# Patient Record
Sex: Female | Born: 1950 | Race: White | Hispanic: No | Smoking: Never smoker
Health system: Southern US, Community
[De-identification: ages and names within clinical notes are randomized; demographics above are authoritative.]

## PROBLEM LIST (undated history)

## (undated) DIAGNOSIS — J45909 Unspecified asthma, uncomplicated: Secondary | ICD-10-CM

## (undated) DIAGNOSIS — N200 Calculus of kidney: Secondary | ICD-10-CM

## (undated) DIAGNOSIS — K219 Gastro-esophageal reflux disease without esophagitis: Secondary | ICD-10-CM

## (undated) DIAGNOSIS — E785 Hyperlipidemia, unspecified: Secondary | ICD-10-CM

## (undated) DIAGNOSIS — I10 Essential (primary) hypertension: Secondary | ICD-10-CM

## (undated) DIAGNOSIS — T7840XA Allergy, unspecified, initial encounter: Secondary | ICD-10-CM

## (undated) HISTORY — DX: Hyperlipidemia, unspecified: E78.5

## (undated) HISTORY — DX: Calculus of kidney: N20.0

## (undated) HISTORY — DX: Unspecified asthma, uncomplicated: J45.909

## (undated) HISTORY — DX: Essential (primary) hypertension: I10

## (undated) HISTORY — PX: DILATION AND CURETTAGE OF UTERUS: SHX78

## (undated) HISTORY — DX: Gastro-esophageal reflux disease without esophagitis: K21.9

## (undated) HISTORY — PX: OTHER SURGICAL HISTORY: SHX169

## (undated) HISTORY — DX: Allergy, unspecified, initial encounter: T78.40XA

---

## 1997-10-24 ENCOUNTER — Other Ambulatory Visit: Admission: RE | Admit: 1997-10-24 | Discharge: 1997-10-24 | Payer: Self-pay | Admitting: Obstetrics and Gynecology

## 1998-10-25 ENCOUNTER — Other Ambulatory Visit: Admission: RE | Admit: 1998-10-25 | Discharge: 1998-10-25 | Payer: Self-pay | Admitting: Obstetrics and Gynecology

## 1999-11-17 ENCOUNTER — Encounter: Admission: RE | Admit: 1999-11-17 | Discharge: 1999-11-17 | Payer: Self-pay | Admitting: Internal Medicine

## 1999-11-17 ENCOUNTER — Encounter: Payer: Self-pay | Admitting: Internal Medicine

## 1999-11-26 ENCOUNTER — Encounter: Payer: Self-pay | Admitting: Internal Medicine

## 1999-11-26 ENCOUNTER — Encounter: Admission: RE | Admit: 1999-11-26 | Discharge: 1999-11-26 | Payer: Self-pay | Admitting: Internal Medicine

## 1999-12-15 ENCOUNTER — Other Ambulatory Visit: Admission: RE | Admit: 1999-12-15 | Discharge: 1999-12-15 | Payer: Self-pay | Admitting: Orthopedic Surgery

## 2000-11-29 ENCOUNTER — Encounter: Admission: RE | Admit: 2000-11-29 | Discharge: 2000-11-29 | Payer: Self-pay | Admitting: Internal Medicine

## 2000-11-29 ENCOUNTER — Encounter: Payer: Self-pay | Admitting: Internal Medicine

## 2000-12-01 ENCOUNTER — Encounter: Admission: RE | Admit: 2000-12-01 | Discharge: 2000-12-01 | Payer: Self-pay | Admitting: Internal Medicine

## 2000-12-01 ENCOUNTER — Encounter: Payer: Self-pay | Admitting: Internal Medicine

## 2000-12-15 ENCOUNTER — Other Ambulatory Visit: Admission: RE | Admit: 2000-12-15 | Discharge: 2000-12-15 | Payer: Self-pay | Admitting: Obstetrics and Gynecology

## 2001-08-26 ENCOUNTER — Encounter: Admission: RE | Admit: 2001-08-26 | Discharge: 2001-08-26 | Payer: Self-pay

## 2001-12-05 ENCOUNTER — Encounter: Admission: RE | Admit: 2001-12-05 | Discharge: 2001-12-05 | Payer: Self-pay | Admitting: Obstetrics and Gynecology

## 2001-12-05 ENCOUNTER — Encounter: Payer: Self-pay | Admitting: Obstetrics and Gynecology

## 2001-12-15 ENCOUNTER — Other Ambulatory Visit: Admission: RE | Admit: 2001-12-15 | Discharge: 2001-12-15 | Payer: Self-pay | Admitting: Obstetrics and Gynecology

## 2002-12-19 ENCOUNTER — Other Ambulatory Visit: Admission: RE | Admit: 2002-12-19 | Discharge: 2002-12-19 | Payer: Self-pay | Admitting: Obstetrics and Gynecology

## 2003-01-23 ENCOUNTER — Encounter: Admission: RE | Admit: 2003-01-23 | Discharge: 2003-01-23 | Payer: Self-pay | Admitting: Obstetrics and Gynecology

## 2003-01-23 ENCOUNTER — Encounter: Payer: Self-pay | Admitting: Obstetrics and Gynecology

## 2003-03-09 ENCOUNTER — Encounter: Admission: RE | Admit: 2003-03-09 | Discharge: 2003-03-09 | Payer: Self-pay | Admitting: Internal Medicine

## 2003-03-09 ENCOUNTER — Encounter: Payer: Self-pay | Admitting: Internal Medicine

## 2003-12-20 ENCOUNTER — Other Ambulatory Visit: Admission: RE | Admit: 2003-12-20 | Discharge: 2003-12-20 | Payer: Self-pay | Admitting: Obstetrics and Gynecology

## 2004-08-05 ENCOUNTER — Encounter: Admission: RE | Admit: 2004-08-05 | Discharge: 2004-08-05 | Payer: Self-pay | Admitting: Obstetrics and Gynecology

## 2005-07-14 ENCOUNTER — Encounter: Admission: RE | Admit: 2005-07-14 | Discharge: 2005-07-14 | Payer: Self-pay | Admitting: Family Medicine

## 2005-09-28 ENCOUNTER — Encounter: Admission: RE | Admit: 2005-09-28 | Discharge: 2005-09-28 | Payer: Self-pay | Admitting: Family Medicine

## 2005-10-16 ENCOUNTER — Ambulatory Visit (HOSPITAL_COMMUNITY): Admission: RE | Admit: 2005-10-16 | Discharge: 2005-10-16 | Payer: Self-pay | Admitting: *Deleted

## 2005-11-03 ENCOUNTER — Encounter (INDEPENDENT_AMBULATORY_CARE_PROVIDER_SITE_OTHER): Payer: Self-pay | Admitting: *Deleted

## 2005-11-03 ENCOUNTER — Ambulatory Visit (HOSPITAL_COMMUNITY): Admission: RE | Admit: 2005-11-03 | Discharge: 2005-11-03 | Payer: Self-pay | Admitting: *Deleted

## 2005-12-02 ENCOUNTER — Encounter: Admission: RE | Admit: 2005-12-02 | Discharge: 2005-12-02 | Payer: Self-pay | Admitting: Family Medicine

## 2006-01-28 ENCOUNTER — Encounter: Admission: RE | Admit: 2006-01-28 | Discharge: 2006-01-28 | Payer: Self-pay | Admitting: Internal Medicine

## 2006-12-06 ENCOUNTER — Encounter: Admission: RE | Admit: 2006-12-06 | Discharge: 2006-12-06 | Payer: Self-pay | Admitting: Internal Medicine

## 2006-12-14 ENCOUNTER — Encounter: Admission: RE | Admit: 2006-12-14 | Discharge: 2006-12-14 | Payer: Self-pay | Admitting: Dermatology

## 2007-12-07 ENCOUNTER — Encounter: Admission: RE | Admit: 2007-12-07 | Discharge: 2007-12-07 | Payer: Self-pay | Admitting: Family Medicine

## 2009-01-14 ENCOUNTER — Encounter: Admission: RE | Admit: 2009-01-14 | Discharge: 2009-01-14 | Payer: Self-pay | Admitting: Family Medicine

## 2009-01-31 ENCOUNTER — Encounter: Admission: RE | Admit: 2009-01-31 | Discharge: 2009-01-31 | Payer: Self-pay | Admitting: Family Medicine

## 2010-01-20 ENCOUNTER — Encounter: Admission: RE | Admit: 2010-01-20 | Discharge: 2010-01-20 | Payer: Self-pay | Admitting: Family Medicine

## 2010-06-28 ENCOUNTER — Encounter: Payer: Self-pay | Admitting: Obstetrics and Gynecology

## 2010-06-29 ENCOUNTER — Encounter: Payer: Self-pay | Admitting: Family Medicine

## 2010-10-21 HISTORY — PX: CHOLECYSTECTOMY: SHX55

## 2010-10-24 NOTE — Op Note (Signed)
NAMELALITHA, ILYAS            ACCOUNT NO.:  [id number]   MEDICAL RECORD NO.:  [id number]          PATIENT TYPE:  AMB   LOCATION:  DAY                          FACILITY:  [facility]   PHYSICIAN:  Alfonse Ras, MD   DATE OF BIRTH:  01-12-1951   DATE OF PROCEDURE:  11/03/2005  DATE OF DISCHARGE:                                 OPERATIVE REPORT   PREOPERATIVE DIAGNOSIS:  Symptomatic cholelithiasis   POSTOPERATIVE DIAGNOSIS:  Symptomatic cholelithiasis.   PROCEDURES:  Laparoscopic cholecystectomy.   SURGEON:  Alfonse Ras, MD.   ASSISTANT:  No assistant.   ANESTHESIA:  General.   DESCRIPTION:  The patient was taken to the operating room, placed in supine  position and after adequate general anesthesia was induced using  endotracheal tube, the abdomen was prepped and draped in the normal sterile  fashion.  Using a transverse infraumbilical incision, I dissected down to  the fascia.  The fascia was opened vertically.  An #0 Vicryl pursestring  suture was placed around the fascial defect.  The Hasson trocar was placed  and pneumoperitoneum was obtained.  An 11 mm trocar was placed in the  subxiphoid region and two 5 mm ports were placed in the right abdomen.  The  gallbladder was identified and retracted cephalad.  Dissecting at the neck,  a critical view was obtained of the cystic duct and cystic artery which were  actually quite long and thin.  They were triply clipped and divided easily.  Adequate hemostasis was ensured.  The gallbladder was then taken off the  gallbladder bed using Bovie electrocautery, placed in an EndoCatch bag and  removed through the umbilical port.  A piece of Surgicel was placed in the  gallbladder bed even though there was minimal bleeding.  The  pneumoperitoneum was released, trocars were removed.  The infraumbilical  fascial defect was closed with the #0 Vicryl pursestring suture.  Skin  incisions were closed with subcuticular 4-0 Monocryl.   Steri-Strips and  sterile dressings were applied.  The patient tolerated the procedure well  and went to PACU in good condition.      Alfonse Ras, MD  Electronically Signed     KRE/MEDQ  D:  11/03/2005  T:  11/03/2005  Job:  [id number]

## 2010-12-23 ENCOUNTER — Other Ambulatory Visit: Payer: Self-pay | Admitting: Family Medicine

## 2010-12-23 DIAGNOSIS — Z1231 Encounter for screening mammogram for malignant neoplasm of breast: Secondary | ICD-10-CM

## 2011-01-08 ENCOUNTER — Ambulatory Visit: Payer: Self-pay | Admitting: *Deleted

## 2011-01-20 ENCOUNTER — Ambulatory Visit: Payer: Self-pay | Admitting: *Deleted

## 2011-01-26 ENCOUNTER — Ambulatory Visit
Admission: RE | Admit: 2011-01-26 | Discharge: 2011-01-26 | Disposition: A | Discharge status: Home / Self Care | Payer: 59 | Source: Ambulatory Visit | Attending: Family Medicine | Admitting: Family Medicine

## 2011-01-26 DIAGNOSIS — Z1231 Encounter for screening mammogram for malignant neoplasm of breast: Secondary | ICD-10-CM

## 2011-01-29 ENCOUNTER — Other Ambulatory Visit: Payer: Self-pay | Admitting: Family Medicine

## 2011-01-29 DIAGNOSIS — R928 Other abnormal and inconclusive findings on diagnostic imaging of breast: Secondary | ICD-10-CM

## 2011-02-03 ENCOUNTER — Ambulatory Visit
Admission: RE | Admit: 2011-02-03 | Discharge: 2011-02-03 | Disposition: A | Discharge status: Home / Self Care | Payer: 59 | Source: Ambulatory Visit | Attending: Family Medicine | Admitting: Family Medicine

## 2011-02-03 DIAGNOSIS — R928 Other abnormal and inconclusive findings on diagnostic imaging of breast: Secondary | ICD-10-CM

## 2011-02-05 ENCOUNTER — Encounter: Payer: 59 | Attending: Family Medicine | Admitting: *Deleted

## 2011-02-05 DIAGNOSIS — Z713 Dietary counseling and surveillance: Secondary | ICD-10-CM | POA: Insufficient documentation

## 2011-02-05 DIAGNOSIS — R7309 Other abnormal glucose: Secondary | ICD-10-CM | POA: Insufficient documentation

## 2011-02-06 ENCOUNTER — Encounter: Payer: Self-pay | Admitting: *Deleted

## 2011-02-06 NOTE — Progress Notes (Signed)
Medical Nutrition Therapy:  Appt start time: 1500  End time:  1600.  Assessment:  Primary concerns today: T2DM Assessment.  Pt states she is very overwhelmed by diagnosis. Reports dx in June 2012 (A1c not available at time of visit) and continues to ask "how did I get this?".  Reports high levels of stress starting in 2006 with the loss of her husband to throat CA. Job highly stressful, but walks 2x/day during breaks with co-worker ~3 d/wk.  Unable to obtain food recall, but pt states she eats out "some" and snacks on baked cheetos and yoplait flavored yogurt. Currently not checking BG d/t faulty meter. Has been replaced and states she will start checking.  Pt wishes to verify insurance coverage of DM before making f/u appt.      MEDICATIONS: Amlodipine, Pravastatin   DIETARY INTAKE:  Unable to obtain. Will attempt again at f/u visit.  Usual physical activity:  Walks 2x/day at work for 15-20 min ea (~3d/wk)  Estimated energy needs: 1500 calories (Maintenance calories per pt request) 170 g carbohydrates 95 g protein 50 g fat  Progress Towards Goal(s):  NEW.   Nutritional Diagnosis:  [place]-2.1 Impaired nutrient utilization related to excessive CHO intake as evidenced by new diagnosis of T2DM and referral by MD for assessment.   Intervention/Goals:  Follow Diabetes Meal Plan as instructed (see yellow card).  Limit carbohydrate intake to 45 grams/meal and 15 grams/snack.  Add lean protein foods to all meals/snacks.  Monitor glucose levels as instructed by your doctor and bring log to next visit.  Aim for 60 mins of physical activity daily.  Monitoring/Evaluation:  Dietary intake, exercise, A1c, blood glucose, and body weight 4 weeks.

## 2011-02-07 ENCOUNTER — Encounter: Payer: Self-pay | Admitting: *Deleted

## 2011-02-07 NOTE — Patient Instructions (Addendum)
Goals:  Follow Diabetes Meal Plan as instructed (see yellow card).  Limit carbohydrate intake to 45 grams/meal and 15 grams/snack.  Add lean protein foods to all meals/snacks.  Monitor glucose levels as instructed by your doctor and bring log to next visit.  Aim for 60 mins of physical activity daily.

## 2011-04-06 ENCOUNTER — Encounter: Payer: 59 | Attending: Family Medicine | Admitting: *Deleted

## 2011-04-06 DIAGNOSIS — R7309 Other abnormal glucose: Secondary | ICD-10-CM | POA: Insufficient documentation

## 2011-04-06 DIAGNOSIS — Z713 Dietary counseling and surveillance: Secondary | ICD-10-CM | POA: Insufficient documentation

## 2011-04-07 ENCOUNTER — Encounter: Payer: Self-pay | Admitting: *Deleted

## 2011-04-07 NOTE — Patient Instructions (Addendum)
Patient did not register to attend Core Diabetes Education Classes II and III;  will follow up prn.

## 2011-04-07 NOTE — Progress Notes (Signed)
Patient was seen on 04/06/11 for the first of a series of three diabetes self-management courses at [facility]. The following learning objectives were met by the patient during this course:   Defines diabetes and the role of insulin  Identifies type of diabetes and pathophysiology  States normal BG range and personal goals  Identifies three risk factors for the development of diabetes  States the need for and frequency of healthcare follow up (ADA Standards of Care)  Last A1c:  6.8% (12/03/10)  Handouts given during class include:  [facility] Core Class 1 Handout  ADA: What's My Number (A1c handout)  [facility] ADA Standards of Care Handout  [facility] Medication Log handout  Patient has established the following initial goals:  Pt did not set goals at visit.  Follow-Up Plan:  PRN

## 2011-07-13 ENCOUNTER — Other Ambulatory Visit: Payer: Self-pay | Admitting: Oncology

## 2012-02-05 ENCOUNTER — Other Ambulatory Visit: Payer: Self-pay | Admitting: Family Medicine

## 2012-02-05 DIAGNOSIS — Z1231 Encounter for screening mammogram for malignant neoplasm of breast: Secondary | ICD-10-CM

## 2012-03-08 ENCOUNTER — Ambulatory Visit
Admission: RE | Admit: 2012-03-08 | Discharge: 2012-03-08 | Disposition: A | Discharge status: Home / Self Care | Payer: 59 | Source: Ambulatory Visit | Attending: Family Medicine | Admitting: Family Medicine

## 2012-03-08 DIAGNOSIS — Z1231 Encounter for screening mammogram for malignant neoplasm of breast: Secondary | ICD-10-CM

## 2012-03-09 ENCOUNTER — Other Ambulatory Visit: Payer: Self-pay | Admitting: Family Medicine

## 2012-03-10 ENCOUNTER — Other Ambulatory Visit: Payer: Self-pay | Admitting: Family Medicine

## 2012-03-10 DIAGNOSIS — R928 Other abnormal and inconclusive findings on diagnostic imaging of breast: Secondary | ICD-10-CM

## 2012-03-15 ENCOUNTER — Ambulatory Visit
Admission: RE | Admit: 2012-03-15 | Discharge: 2012-03-15 | Disposition: A | Discharge status: Home / Self Care | Payer: 59 | Source: Ambulatory Visit | Attending: Family Medicine | Admitting: Family Medicine

## 2012-03-15 DIAGNOSIS — R928 Other abnormal and inconclusive findings on diagnostic imaging of breast: Secondary | ICD-10-CM

## 2012-09-28 ENCOUNTER — Other Ambulatory Visit: Payer: Self-pay | Admitting: Family Medicine

## 2012-09-28 DIAGNOSIS — N951 Menopausal and female climacteric states: Secondary | ICD-10-CM

## 2012-11-11 ENCOUNTER — Other Ambulatory Visit: Payer: 59

## 2012-12-19 ENCOUNTER — Other Ambulatory Visit: Payer: 59

## 2012-12-26 ENCOUNTER — Other Ambulatory Visit: Payer: 59

## 2013-10-03 ENCOUNTER — Other Ambulatory Visit: Payer: Self-pay | Admitting: Family Medicine

## 2013-10-03 DIAGNOSIS — Z1231 Encounter for screening mammogram for malignant neoplasm of breast: Secondary | ICD-10-CM

## 2013-10-09 ENCOUNTER — Ambulatory Visit
Admission: RE | Admit: 2013-10-09 | Discharge: 2013-10-09 | Disposition: A | Discharge status: Home / Self Care | Payer: No Typology Code available for payment source | Source: Ambulatory Visit | Attending: Family Medicine | Admitting: Family Medicine

## 2013-10-09 ENCOUNTER — Encounter (INDEPENDENT_AMBULATORY_CARE_PROVIDER_SITE_OTHER): Payer: Self-pay

## 2013-10-09 DIAGNOSIS — Z1231 Encounter for screening mammogram for malignant neoplasm of breast: Secondary | ICD-10-CM

## 2014-10-02 ENCOUNTER — Other Ambulatory Visit: Payer: Self-pay

## 2014-10-02 DIAGNOSIS — Z1231 Encounter for screening mammogram for malignant neoplasm of breast: Secondary | ICD-10-CM

## 2014-10-30 ENCOUNTER — Other Ambulatory Visit: Payer: Self-pay | Admitting: Family Medicine

## 2014-10-30 DIAGNOSIS — E2839 Other primary ovarian failure: Secondary | ICD-10-CM

## 2014-11-01 ENCOUNTER — Ambulatory Visit
Admission: RE | Admit: 2014-11-01 | Discharge: 2014-11-01 | Disposition: A | Discharge status: Home / Self Care | Payer: 59 | Source: Ambulatory Visit

## 2014-11-01 ENCOUNTER — Ambulatory Visit
Admission: RE | Admit: 2014-11-01 | Discharge: 2014-11-01 | Disposition: A | Discharge status: Home / Self Care | Payer: 59 | Source: Ambulatory Visit | Attending: Family Medicine | Admitting: Family Medicine

## 2014-11-01 DIAGNOSIS — E2839 Other primary ovarian failure: Secondary | ICD-10-CM

## 2014-11-01 DIAGNOSIS — Z1231 Encounter for screening mammogram for malignant neoplasm of breast: Secondary | ICD-10-CM

## 2015-01-15 ENCOUNTER — Ambulatory Visit
Admission: RE | Admit: 2015-01-15 | Discharge: 2015-01-15 | Disposition: A | Discharge status: Home / Self Care | Payer: 59 | Source: Ambulatory Visit | Attending: Nurse Practitioner | Admitting: Nurse Practitioner

## 2015-01-15 ENCOUNTER — Other Ambulatory Visit: Payer: Self-pay | Admitting: Nurse Practitioner

## 2015-01-15 DIAGNOSIS — R109 Unspecified abdominal pain: Secondary | ICD-10-CM

## 2015-10-01 ENCOUNTER — Other Ambulatory Visit: Payer: Self-pay

## 2015-10-01 DIAGNOSIS — Z1231 Encounter for screening mammogram for malignant neoplasm of breast: Secondary | ICD-10-CM

## 2015-11-05 ENCOUNTER — Ambulatory Visit
Admission: RE | Admit: 2015-11-05 | Discharge: 2015-11-05 | Disposition: A | Discharge status: Home / Self Care | Payer: BLUE CROSS/BLUE SHIELD | Source: Ambulatory Visit

## 2015-11-05 DIAGNOSIS — Z1231 Encounter for screening mammogram for malignant neoplasm of breast: Secondary | ICD-10-CM

## 2015-12-05 ENCOUNTER — Ambulatory Visit (INDEPENDENT_AMBULATORY_CARE_PROVIDER_SITE_OTHER): Payer: BLUE CROSS/BLUE SHIELD | Admitting: Family Medicine

## 2015-12-05 ENCOUNTER — Encounter: Payer: Self-pay | Admitting: Family Medicine

## 2015-12-05 VITALS — BP 109/68 | HR 77 | Ht 63.75 in | Wt 146.2 lb

## 2015-12-05 DIAGNOSIS — Z1159 Encounter for screening for other viral diseases: Secondary | ICD-10-CM

## 2015-12-23 NOTE — Progress Notes (Signed)
No charge.  Pt not evaluated by me today.

## 2016-03-10 ENCOUNTER — Ambulatory Visit (INDEPENDENT_AMBULATORY_CARE_PROVIDER_SITE_OTHER): Payer: BLUE CROSS/BLUE SHIELD | Admitting: Podiatry

## 2016-03-10 ENCOUNTER — Encounter: Payer: Self-pay | Admitting: Podiatry

## 2016-03-10 VITALS — BP 104/60 | HR 56 | Resp 16

## 2016-03-10 DIAGNOSIS — M67479 Ganglion, unspecified ankle and foot: Secondary | ICD-10-CM

## 2016-03-10 NOTE — Progress Notes (Signed)
   Subjective:    Patient ID: Belinda Mcmillan, female    DOB: [birth year], 65 y.o.   MRN: [id number]  HPI      Review of Systems  All other systems reviewed and are negative.      Objective:   Physical Exam        Assessment & Plan:

## 2016-03-10 NOTE — Patient Instructions (Signed)
Today your diabetic foot screen was within normal limits. There is a cyst on the second left toe most likely mucus-like cyst that when drained released some synovial fluid as I would expect. These cysts can recur. Leave the compression dressing on 24-48 hours Return as needed if the lesion recurs  Diabetes and Foot Care Diabetes may cause you to have problems because of poor blood supply (circulation) to your feet and legs. This may cause the skin on your feet to become thinner, break easier, and heal more slowly. Your skin may become dry, and the skin may peel and crack. You may also have nerve damage in your legs and feet causing decreased feeling in them. You may not notice minor injuries to your feet that could lead to infections or more serious problems. Taking care of your feet is one of the most important things you can do for yourself.  HOME CARE INSTRUCTIONS  Wear shoes at all times, even in the house. Do not go barefoot. Bare feet are easily injured.  Check your feet daily for blisters, cuts, and redness. If you cannot see the bottom of your feet, use a mirror or ask someone for help.  Wash your feet with warm water (do not use hot water) and mild soap. Then pat your feet and the areas between your toes until they are completely dry. Do not soak your feet as this can dry your skin.  Apply a moisturizing lotion or petroleum jelly (that does not contain alcohol and is unscented) to the skin on your feet and to dry, brittle toenails. Do not apply lotion between your toes.  Trim your toenails straight across. Do not dig under them or around the cuticle. File the edges of your nails with an emery board or nail file.  Do not cut corns or calluses or try to remove them with medicine.  Wear clean socks or stockings every day. Make sure they are not too tight. Do not wear knee-high stockings since they may decrease blood flow to your legs.  Wear shoes that fit properly and have enough  cushioning. To break in new shoes, wear them for just a few hours a day. This prevents you from injuring your feet. Always look in your shoes before you put them on to be sure there are no objects inside.  Do not cross your legs. This may decrease the blood flow to your feet.  If you find a minor scrape, cut, or break in the skin on your feet, keep it and the skin around it clean and dry. These areas may be cleansed with mild soap and water. Do not cleanse the area with peroxide, alcohol, or iodine.  When you remove an adhesive bandage, be sure not to damage the skin around it.  If you have a wound, look at it several times a day to make sure it is healing.  Do not use heating pads or hot water bottles. They may burn your skin. If you have lost feeling in your feet or legs, you may not know it is happening until it is too late.  Make sure your health care provider performs a complete foot exam at least annually or more often if you have foot problems. Report any cuts, sores, or bruises to your health care provider immediately. SEEK MEDICAL CARE IF:   You have an injury that is not healing.  You have cuts or breaks in the skin.  You have an ingrown nail.  You notice  redness on your legs or feet.  You feel burning or tingling in your legs or feet.  You have pain or cramps in your legs and feet.  Your legs or feet are numb.  Your feet always feel cold. SEEK IMMEDIATE MEDICAL CARE IF:   There is increasing redness, swelling, or pain in or around a wound.  There is a red line that goes up your leg.  Pus is coming from a wound.  You develop a fever or as directed by your health care provider.  You notice a bad smell coming from an ulcer or wound.   This information is not intended to replace advice given to you by your health care provider. Make sure you discuss any questions you have with your health care provider.   Document Released: 05/22/2000 Document Revised: 01/25/2013  Document Reviewed: 11/01/2012 Elsevier Interactive Patient Education [employer].

## 2016-03-10 NOTE — Progress Notes (Signed)
   Subjective:    Patient ID: Belinda Mcmillan, female    DOB: [birth year], 65 y.o.   MRN: [id number]  Toe Pain      This patient presents today complaining of a blisterlike lesion on the second left toe present for approximately 6 months. She complains of some mild occasional discomfort with direct shoe pressure or rubbing from the adjacent toe. The lesion has not increased in size or symptoms over time. She soaks the toe in Epsom salts occasionally if it becomes sensitive which does provide some temporary relief   Review of Systems     Objective:   Physical Exam  Orientated 3  Vascular: No peripheral edema or calf edema bilaterally DP and PT pulses 2/4 bilaterally Capillary reflex immediate bilaterally  Neurological: Sensation to 10 g monofilament wire intact 5/5 bilaterally Vibratory sensation intact bilaterally Ankle reflex equal and reactive bilaterally  Dermatological: No open skin lesions bilaterally Raised 5 mm translucent cyst dorsal second left toe over interphalangeal joint  Musculoskeletal: Flexible hammer toes 2-5 bilaterally Manual motor testing dorsi flexion, plantar flexion, eversion, eversion 5/5 bilaterally         Assessment & Plan:   Assessment: Pre-diabetic with satisfactory neurovascular status Mucous cyst second left toe  Plan: I reviewed the results of the exam with patient today and offered needle puncture of the lesion and deposition of steroid. Patient advised lesion could recur. Patient verbally consents The system the second left toe is prepped with alcohol and Betadine and was punctured expressing a small amount of clear white viscous fluid consistent with synovial fluid and 5 mg of dexamethasone phosphate mixed with 1.5 mg a plain Sensorcaine were injected into the cyst area further flushing out the synovial fluid. The cyst collapsed. An compression dressing was applied to the toe with instructions to leave on 1-2 days  Return as  needed for follow-up

## 2016-08-31 ENCOUNTER — Other Ambulatory Visit: Payer: Self-pay | Admitting: Family Medicine

## 2016-08-31 DIAGNOSIS — R109 Unspecified abdominal pain: Secondary | ICD-10-CM

## 2016-09-04 ENCOUNTER — Other Ambulatory Visit: Payer: BLUE CROSS/BLUE SHIELD

## 2016-09-16 ENCOUNTER — Ambulatory Visit
Admission: RE | Admit: 2016-09-16 | Discharge: 2016-09-16 | Disposition: A | Discharge status: Home / Self Care | Payer: BLUE CROSS/BLUE SHIELD | Source: Ambulatory Visit | Attending: Family Medicine | Admitting: Family Medicine

## 2016-09-16 DIAGNOSIS — R109 Unspecified abdominal pain: Secondary | ICD-10-CM

## 2016-09-24 ENCOUNTER — Other Ambulatory Visit: Payer: Self-pay | Admitting: Family Medicine

## 2016-09-24 DIAGNOSIS — Z1231 Encounter for screening mammogram for malignant neoplasm of breast: Secondary | ICD-10-CM

## 2016-11-19 ENCOUNTER — Ambulatory Visit: Payer: BLUE CROSS/BLUE SHIELD

## 2017-04-14 ENCOUNTER — Other Ambulatory Visit: Payer: Self-pay | Admitting: Family Medicine

## 2017-04-14 DIAGNOSIS — Z1231 Encounter for screening mammogram for malignant neoplasm of breast: Secondary | ICD-10-CM

## 2017-05-04 ENCOUNTER — Ambulatory Visit: Payer: BLUE CROSS/BLUE SHIELD

## 2017-05-27 ENCOUNTER — Ambulatory Visit: Payer: Self-pay

## 2017-06-24 ENCOUNTER — Ambulatory Visit: Payer: Self-pay

## 2017-07-21 ENCOUNTER — Ambulatory Visit
Admission: RE | Admit: 2017-07-21 | Discharge: 2017-07-21 | Disposition: A | Discharge status: Home / Self Care | Payer: PRIVATE HEALTH INSURANCE | Source: Ambulatory Visit | Attending: Family Medicine | Admitting: Family Medicine

## 2017-07-21 DIAGNOSIS — Z1231 Encounter for screening mammogram for malignant neoplasm of breast: Secondary | ICD-10-CM

## 2018-06-13 ENCOUNTER — Other Ambulatory Visit: Payer: Self-pay | Admitting: Family Medicine

## 2018-06-13 DIAGNOSIS — Z1231 Encounter for screening mammogram for malignant neoplasm of breast: Secondary | ICD-10-CM

## 2018-07-22 ENCOUNTER — Ambulatory Visit
Admission: RE | Admit: 2018-07-22 | Discharge: 2018-07-22 | Disposition: A | Discharge status: Home / Self Care | Payer: Medicare HMO | Source: Ambulatory Visit | Attending: Family Medicine | Admitting: Family Medicine

## 2018-07-22 DIAGNOSIS — Z1231 Encounter for screening mammogram for malignant neoplasm of breast: Secondary | ICD-10-CM

## 2019-06-14 ENCOUNTER — Other Ambulatory Visit: Payer: Self-pay | Admitting: Family Medicine

## 2019-06-14 DIAGNOSIS — Z1231 Encounter for screening mammogram for malignant neoplasm of breast: Secondary | ICD-10-CM

## 2019-07-14 ENCOUNTER — Other Ambulatory Visit: Payer: Self-pay | Admitting: Gastroenterology

## 2019-07-14 DIAGNOSIS — R140 Abdominal distension (gaseous): Secondary | ICD-10-CM

## 2019-07-19 ENCOUNTER — Ambulatory Visit
Admission: RE | Admit: 2019-07-19 | Discharge: 2019-07-19 | Disposition: A | Discharge status: Home / Self Care | Payer: Medicare HMO | Source: Ambulatory Visit | Attending: Gastroenterology | Admitting: Gastroenterology

## 2019-07-19 DIAGNOSIS — R140 Abdominal distension (gaseous): Secondary | ICD-10-CM

## 2019-07-25 ENCOUNTER — Ambulatory Visit: Payer: Medicare HMO

## 2019-08-04 ENCOUNTER — Ambulatory Visit: Payer: Self-pay

## 2019-09-04 ENCOUNTER — Ambulatory Visit: Payer: Self-pay

## 2019-09-21 ENCOUNTER — Ambulatory Visit (INDEPENDENT_AMBULATORY_CARE_PROVIDER_SITE_OTHER): Payer: Medicare HMO | Admitting: Critical Care Medicine

## 2019-09-21 ENCOUNTER — Other Ambulatory Visit: Payer: Self-pay

## 2019-09-21 ENCOUNTER — Encounter: Payer: Self-pay | Admitting: Critical Care Medicine

## 2019-09-21 VITALS — BP 104/64 | HR 97 | Temp 97.3°F | Ht 64.17 in | Wt 137.4 lb

## 2019-09-21 DIAGNOSIS — R059 Cough, unspecified: Secondary | ICD-10-CM

## 2019-09-21 DIAGNOSIS — J302 Other seasonal allergic rhinitis: Secondary | ICD-10-CM | POA: Diagnosis not present

## 2019-09-21 DIAGNOSIS — R05 Cough: Secondary | ICD-10-CM

## 2019-09-21 MED ORDER — FLUTICASONE PROPIONATE 50 MCG/ACT NA SUSP: 2.0000 | Freq: Every day | NASAL | 11 refills | Status: DC

## 2019-09-21 MED ORDER — PROMETHAZINE HCL 6.25 MG/5ML PO SYRP: 6.2500 mg | ORAL_SOLUTION | Freq: Every evening | ORAL | 0 refills | Status: DC | PRN

## 2019-09-21 NOTE — Progress Notes (Signed)
Synopsis: Referred in April 2021 for cough by Leonides Sake, MD.  Subjective:   PATIENT ID: Belinda Mcmillan: female DOB: Sep 20, 1950, MRN: [id number]  Chief Complaint  Patient presents with  . Consult    Patient has dry cough that started in March. Was seen at urgent care on 4/10 for upper resp infection. Patient has no energy. Patient was given tesslon pearls, prednisone and azithromycin on 4/1 and feels a little better. Was tested for Covid last Thursday it was NEG    Belinda Mcmillan is a 69 year old woman who presents for cough that has been going on for about 2 weeks.  This began with cold-like symptoms, fever, painful adenopathy in her neck.  She had a sore throat, and cough which has persisted.  She also has a headache, sinus pressure, nasal congestion, itching in her throat, sneezing, watery eyes.  She has significant ongoing fatigue.  Her cough is dry.  She was previously prescribed prednisone, azithromycin, Tessalon without significant benefit in her symptoms.  She is feeling somewhat improved, but mostly complains of ongoing fatigue.  She was Covid negative last week.  She is unsure if she has a history of allergies, but has been taking cetirizine-pseudoephedrine plus Mucinex DM.  She was told that she has thrush.  When she has had cold in the past she does not usually have lingering symptoms.  She tends to get colds frequently in the spring and fall.  She denies shortness of breath, wheezing, rhinorrhea, nausea, vomiting, diarrhea.  She has occasional green sputum for the past week and indigestion.  She is having insomnia since starting the cetirizine-pseudoephedrine.  There is no family history of lung disease.  She is a never smoker.  She had a chest x-ray at [facility] which was read as normal.     Past Medical History:  Diagnosis Date  . Allergy   . Diabetes mellitus June 2012   T2DM  . Hyperlipidemia   . Hypertension      Family History  Problem Relation Age of Onset   . Cancer Mother        melanoma  . Cancer Father        lung  . Healthy Sister   . Healthy Daughter   . Cancer Other   . Breast cancer Maternal Aunt 55     Past Surgical History:  Procedure Laterality Date  . CHOLECYSTECTOMY  10/21/10  . DILATION AND CURETTAGE OF UTERUS    . Miscarriage x2      Social History   Socioeconomic History  . Marital status: Widowed    Spouse name: Not on file  . Number of children: Not on file  . Years of education: Not on file  . Highest education level: Not on file  Occupational History  . Not on file  Tobacco Use  . Smoking status: Passive Smoke Exposure - Never Smoker  . Smokeless tobacco: Never Used  Substance and Sexual Activity  . Alcohol use: No  . Drug use: No  . Sexual activity: Never  Other Topics Concern  . Not on file  Social History Narrative  . Not on file   Social Determinants of Health   Financial Resource Strain:   . Difficulty of Paying Living Expenses:   Food Insecurity:   . Worried About Charity fundraiser in the Last Year:   . Arboriculturist in the Last Year:   Transportation Needs:   . Film/video editor (Medical):   Marland Kitchen  Lack of Transportation (Non-Medical):   Physical Activity:   . Days of Exercise per Week:   . Minutes of Exercise per Session:   Stress:   . Feeling of Stress :   Social Connections:   . Frequency of Communication with Friends and Family:   . Frequency of Social Gatherings with Friends and Family:   . Attends Religious Services:   . Active Member of Clubs or Organizations:   . Attends Archivist Meetings:   Marland Kitchen Marital Status:   Intimate Partner Violence:   . Fear of Current or Ex-Partner:   . Emotionally Abused:   Marland Kitchen Physically Abused:   . Sexually Abused:      Allergies  Allergen Reactions  . Penicillins      Immunization History  Administered Date(s) Administered  . Pneumococcal Conjugate-13 12/13/2017    Outpatient Medications Prior to Visit  Medication  Sig Dispense Refill  . benzonatate (TESSALON) 200 MG capsule Take 200 mg by mouth as needed.    . cetirizine-pseudoephedrine (ZYRTEC-D) 5-120 MG tablet Take 1 tablet by mouth daily.    . clotrimazole (MYCELEX) 10 MG troche Take 10 mg by mouth 5 (five) times daily.    Marland Kitchen dextromethorphan-guaiFENesin (MUCINEX DM) 30-600 MG 12hr tablet Take 1 tablet by mouth daily.    Marland Kitchen losartan (COZAAR) 100 MG tablet Take 100 mg by mouth daily.    . pravastatin (PRAVACHOL) 40 MG tablet Take 40 mg by mouth daily.      . chlorpheniramine (CHLOR-TRIMETON) 4 MG tablet Take 4 mg by mouth daily.     No facility-administered medications prior to visit.    Review of Systems  Constitutional: Positive for malaise/fatigue. Negative for fever and weight loss.  HENT: Positive for congestion and sore throat.   Respiratory: Positive for cough and sputum production. Negative for shortness of breath and wheezing.   Cardiovascular: Negative for chest pain and leg swelling.  Gastrointestinal: Positive for heartburn. Negative for nausea and vomiting.  Endo/Heme/Allergies: Positive for environmental allergies.     Objective:   Vitals:   09/21/19 1342  BP: 104/64  Pulse: 97  Temp: (!) 97.3 F (36.3 C)  TempSrc: Temporal  SpO2: 98%  Weight: 137 lb 6.4 oz (62.3 kg)  Height: 5' 4.17" (1.63 m)   98% on   RA BMI Readings from Last 3 Encounters:  09/21/19 23.46 kg/m  12/05/15 25.29 kg/m  04/06/11 24.96 kg/m   Wt Readings from Last 3 Encounters:  09/21/19 137 lb 6.4 oz (62.3 kg)  12/05/15 146 lb 3.2 oz (66.3 kg)  04/06/11 150 lb (68 kg)    Physical Exam Vitals reviewed.  Constitutional:      General: She is not in acute distress.    Appearance: She is not ill-appearing.  HENT:     Head: Normocephalic and atraumatic.     Nose:     Comments: Erythematous and edematous nasal turbinates with clear drainage    Mouth/Throat:     Comments: Mallampati 4, no thrush on her palate. Eyes:     General: No scleral  icterus. Cardiovascular:     Rate and Rhythm: Normal rate and regular rhythm.     Heart sounds: No murmur.  Pulmonary:     Comments: Breathing comfortably on room air, no conversational dyspnea.  Coughed only once or twice during visit. Abdominal:     General: There is no distension.     Palpations: Abdomen is soft.     Tenderness: There is no abdominal tenderness.  Musculoskeletal:        General: No swelling or deformity.     Cervical back: Neck supple.  Lymphadenopathy:     Cervical: No cervical adenopathy.  Skin:    General: Skin is warm and dry.     Findings: No rash.  Neurological:     General: No focal deficit present.     Mental Status: She is alert.     Coordination: Coordination normal.  Psychiatric:        Mood and Affect: Mood normal.        Behavior: Behavior normal.      CBC No results found for: WBC, RBC, HGB, HCT, PLT, MCV, MCH, MCHC, RDW, LYMPHSABS, MONOABS, EOSABS, BASOSABS  CHEMISTRY No results for input(s): NA, K, CL, CO2, GLUCOSE, BUN, CREATININE, CALCIUM, MG, PHOS in the last 168 hours. CrCl cannot be calculated (No successful lab value found.).   Chest Imaging- films reviewed: None available  Pulmonary Functions Testing Results: No flowsheet data found.      Assessment & Plan:     ICD-10-CM   1. Cough  R05   2. Seasonal allergic rhinitis, unspecified trigger  J30.2    Cough likely persistent after acute viral upper respiratory infection.  Possibly ongoing allergic symptoms. -Stop taking cetirizine- D (Switch to regular cetirizine without the decongenstant) -Start flonase 2 sprays bilaterally once daily. -Okay to keep taking Mucinex- DM. -Stay hydrated  -Voice rest for a few days- aggressively treat cough with benzonate and cough drops, warm liquids for 2-3 days. -Phenergan cough syrup nightly as needed.  Instructed not to drive after using this due to risk of sedation.  Allergic rhinosinusitis -Cetirizine -Flonase -Mucinex  RTC in  1 month.  If symptoms fail to resolve can order PFTs.   Current Outpatient Medications:  .  benzonatate (TESSALON) 200 MG capsule, Take 200 mg by mouth as needed., Disp: , Rfl:  .  cetirizine-pseudoephedrine (ZYRTEC-D) 5-120 MG tablet, Take 1 tablet by mouth daily., Disp: , Rfl:  .  clotrimazole (MYCELEX) 10 MG troche, Take 10 mg by mouth 5 (five) times daily., Disp: , Rfl:  .  dextromethorphan-guaiFENesin (MUCINEX DM) 30-600 MG 12hr tablet, Take 1 tablet by mouth daily., Disp: , Rfl:  .  losartan (COZAAR) 100 MG tablet, Take 100 mg by mouth daily., Disp: , Rfl:  .  pravastatin (PRAVACHOL) 40 MG tablet, Take 40 mg by mouth daily.  , Disp: , Rfl:  .  fluticasone (FLONASE) 50 MCG/ACT nasal spray, Place 2 sprays into both nostrils daily., Disp: 16 g, Rfl: 11 .  promethazine (PHENERGAN) 6.25 MG/5ML syrup, Take 5 mLs (6.25 mg total) by mouth at bedtime as needed (cough at bedtime)., Disp: 120 mL, Rfl: 0     Julian Hy, DO [facility] 09/21/2019 2:10 PM

## 2019-09-21 NOTE — Patient Instructions (Addendum)
Thank you for visiting Dr. Carlis Abbott at [facility]. We recommend the following:  -Stop taking cetirizine- D (Switch to regular cetirizine without the decongenstant) -Start flonase 2 sprays bilaterally once daily. -Okay to keep taking Mucinex- DM. -Stay hydrated  -Voice rest for a few days- aggressively treat cough with benzonate and cough drops, warm liquids for 2-3 days.  No driving when you use the Phenergan cough syrup (take only at bedtime).     Meds ordered this encounter  Medications  . fluticasone (FLONASE) 50 MCG/ACT nasal spray    Sig: Place 2 sprays into both nostrils daily.    Dispense:  16 g    Refill:  11  . promethazine (PHENERGAN) 6.25 MG/5ML syrup    Sig: Take 5 mLs (6.25 mg total) by mouth at bedtime as needed (cough at bedtime).    Dispense:  120 mL    Refill:  0    Return in about 4 weeks (around 10/19/2019).    Please do your part to reduce the spread of COVID-19.

## 2019-09-25 ENCOUNTER — Ambulatory Visit
Admission: RE | Admit: 2019-09-25 | Discharge: 2019-09-25 | Disposition: A | Discharge status: Home / Self Care | Payer: Medicare HMO | Source: Ambulatory Visit | Attending: Family Medicine | Admitting: Family Medicine

## 2019-09-25 ENCOUNTER — Other Ambulatory Visit: Payer: Self-pay

## 2019-09-25 DIAGNOSIS — Z1231 Encounter for screening mammogram for malignant neoplasm of breast: Secondary | ICD-10-CM

## 2019-09-26 ENCOUNTER — Other Ambulatory Visit: Payer: Self-pay | Admitting: Family Medicine

## 2019-09-26 ENCOUNTER — Ambulatory Visit (INDEPENDENT_AMBULATORY_CARE_PROVIDER_SITE_OTHER): Payer: Medicare HMO | Admitting: Primary Care

## 2019-09-26 DIAGNOSIS — R05 Cough: Secondary | ICD-10-CM

## 2019-09-26 DIAGNOSIS — R928 Other abnormal and inconclusive findings on diagnostic imaging of breast: Secondary | ICD-10-CM

## 2019-09-26 DIAGNOSIS — R053 Chronic cough: Secondary | ICD-10-CM

## 2019-09-26 MED ORDER — BENZONATATE 200 MG PO CAPS: 200.0000 mg | ORAL_CAPSULE | Freq: Two times a day (BID) | ORAL | 1 refills | Status: AC | PRN

## 2019-09-26 MED ORDER — FAMOTIDINE 20 MG PO TABS: 20.0000 mg | ORAL_TABLET | Freq: Every day | ORAL | 0 refills | Status: DC

## 2019-09-26 NOTE — Progress Notes (Signed)
Virtual Visit via Telephone Note  I connected with Belinda Mcmillan on 09/26/19 at  3:00 PM EDT by telephone and verified that I am speaking with the correct person using two identifiers.  Location: Patient: Home Provider: Home   I discussed the limitations, risks, security and privacy concerns of performing an evaluation and management service by telephone and the availability of in person appointments. I also discussed with the patient that there may be a patient responsible charge related to this service. The patient expressed understanding and agreed to proceed.   History of Present Illness: 69 year old female, passive smoker. Patient of Dr. Carlis Abbott, seen for initial consult on 09/21/19 for cough x 2 weeks. She was previously prescribed prednisone and azithromycin and tessalon Perles without significant improvement. Covid negative. She is unsure if her symptoms are related to allergies. She has been taking cetirizine-D, changed to regular strength and added flonase nasal spray. Normal CXR at [facility]. Denies shortness of breath, wheezing.   09/26/2019 Patient contacted today for acute televisit. Reports that she has had a cough since the end of March. Her cough is mostly dry. No significant mucus production, sinus or chest congestion. She is currently using flonase nasal spray and Zyrtec. She has been using phenergan cough syrup at night some with some improvement. She wakes up at night with cough around 2am. She does have heart burn and will take Rolaids. Denies shortness of breath, wheezing, leg swelling.   Observations/Objective:  - Able to speak in full sentences, no significant shortness of breath or wheezing  Assessment and Plan:  Chronic cough: - Most consistent with upper airway cough syndrome d/t GERD symptoms/PND - Trial famotidine 20mg  qhs - Continue flonase nasal spray and OTC antihistamine - Use tessalon perles 200mg  BID prn cough  - Recommend stop eating 2-3 hours prior to  bed - Advised voice rest, try not coughing/clearing throat as much as possible with sips of water and using hard candies, avoid mint/menthol products   Follow Up Instructions:   - Keep fu in 1 month with Dr. Carlis Abbott   I discussed the assessment and treatment plan with the patient. The patient was provided an opportunity to ask questions and all were answered. The patient agreed with the plan and demonstrated an understanding of the instructions.   The patient was advised to call back or seek an in-person evaluation if the symptoms worsen or if the condition fails to improve as anticipated.  I provided 18 minutes of non-face-to-face time during this encounter.   [name], NP

## 2019-09-26 NOTE — Patient Instructions (Addendum)
-   Trial famotidine 20mg  qhs - Continue flonase nasal spray  - Continue Zyrtec antihistamine - Recommend stop eating 2-3 hours prior to bed - Advised voice rest, try not coughing/clearing throat as much as possible with sips of water and using hard candies, avoid mint/menthol products

## 2019-09-27 ENCOUNTER — Encounter: Payer: Self-pay | Admitting: Primary Care

## 2019-10-02 ENCOUNTER — Ambulatory Visit (INDEPENDENT_AMBULATORY_CARE_PROVIDER_SITE_OTHER): Payer: Medicare HMO | Admitting: Primary Care

## 2019-10-02 ENCOUNTER — Other Ambulatory Visit: Payer: Self-pay

## 2019-10-02 DIAGNOSIS — R059 Cough, unspecified: Secondary | ICD-10-CM

## 2019-10-02 DIAGNOSIS — R05 Cough: Secondary | ICD-10-CM

## 2019-10-02 MED ORDER — PRAVASTATIN SODIUM 40 MG PO TABS: 40.0000 mg | ORAL_TABLET | Freq: Every day | ORAL | 1 refills | Status: DC

## 2019-10-02 NOTE — Patient Instructions (Addendum)
Chronic cough: - Recommend resume Famotidine 20mg  daily - Continue flonase nasal spray once daily and antihistamine - Continue Tessalon perles 200mg  twice daily as needed for cough - Continue Promethazine 88ml at bedtime  - Check spirometry and FENO at next visit   FU with Dr. Carlis Abbott 17th    Food Choices for Gastroesophageal Reflux Disease, Adult When you have gastroesophageal reflux disease (GERD), the foods you eat and your eating habits are very important. Choosing the right foods can help ease the discomfort of GERD. Consider working with a diet and nutrition specialist (dietitian) to help you make healthy food choices. What general guidelines should I follow?  Eating plan  Choose healthy foods low in fat, such as fruits, vegetables, whole grains, low-fat dairy products, and lean meat, fish, and poultry.  Eat frequent, small meals instead of three large meals each day. Eat your meals slowly, in a relaxed setting. Avoid bending over or lying down until 2-3 hours after eating.  Limit high-fat foods such as fatty meats or fried foods.  Limit your intake of oils, butter, and shortening to less than 8 teaspoons each day.  Avoid the following: ? Foods that cause symptoms. These may be different for different people. Keep a food diary to keep track of foods that cause symptoms. ? Alcohol. ? Drinking large amounts of liquid with meals. ? Eating meals during the 2-3 hours before bed.  Cook foods using methods other than frying. This may include baking, grilling, or broiling. Lifestyle  Maintain a healthy weight. Ask your health care provider what weight is healthy for you. If you need to lose weight, work with your health care provider to do so safely.  Exercise for at least 30 minutes on 5 or more days each week, or as told by your health care provider.  Avoid wearing clothes that fit tightly around your waist and chest.  Do not use any products that contain nicotine or tobacco, such  as cigarettes and e-cigarettes. If you need help quitting, ask your health care provider.  Sleep with the head of your bed raised. Use a wedge under the mattress or blocks under the bed frame to raise the head of the bed. What foods are not recommended? The items listed may not be a complete list. Talk with your dietitian about what dietary choices are best for you. Grains Pastries or quick breads with added fat. [place] toast. Vegetables Deep fried vegetables. [place] fries. Any vegetables prepared with added fat. Any vegetables that cause symptoms. For some people this may include tomatoes and tomato products, chili peppers, onions and garlic, and horseradish. Fruits Any fruits prepared with added fat. Any fruits that cause symptoms. For some people this may include citrus fruits, such as oranges, grapefruit, pineapple, and lemons. Meats and other protein foods High-fat meats, such as fatty beef or pork, hot dogs, ribs, ham, sausage, salami and bacon. Fried meat or protein, including fried fish and fried chicken. Nuts and nut butters. Dairy Whole milk and chocolate milk. Sour cream. Cream. Ice cream. Cream cheese. Milk shakes. Beverages Coffee and tea, with or without caffeine. Carbonated beverages. Sodas. Energy drinks. Fruit juice made with acidic fruits (such as orange or grapefruit). Tomato juice. Alcoholic drinks. Fats and oils Butter. Margarine. Shortening. Ghee. Sweets and desserts Chocolate and cocoa. Donuts. Seasoning and other foods Pepper. Peppermint and spearmint. Any condiments, herbs, or seasonings that cause symptoms. For some people, this may include curry, hot sauce, or vinegar-based salad dressings. Summary  When you  have gastroesophageal reflux disease (GERD), food and lifestyle choices are very important to help ease the discomfort of GERD.  Eat frequent, small meals instead of three large meals each day. Eat your meals slowly, in a relaxed setting. Avoid bending over  or lying down until 2-3 hours after eating.  Limit high-fat foods such as fatty meat or fried foods. This information is not intended to replace advice given to you by your health care provider. Make sure you discuss any questions you have with your health care provider. Document Revised: 09/15/2018 Document Reviewed: 05/26/2016 Elsevier Patient Education  [place].

## 2019-10-02 NOTE — Progress Notes (Signed)
Virtual Visit via Telephone Note  I connected with Belinda Mcmillan on 10/02/19 at  9:30 AM EDT by telephone and verified that I am speaking with the correct person using two identifiers.  Location: Patient: Home Provider: Office   I discussed the limitations, risks, security and privacy concerns of performing an evaluation and management service by telephone and the availability of in person appointments. I also discussed with the patient that there may be a patient responsible charge related to this service. The patient expressed understanding and agreed to proceed.   History of Present Illness:  69 year old female, passive smoker. Patient of Dr. Carlis Abbott, seen for initial consult on 09/21/19 for cough x 2 weeks. She was previously prescribed prednisone and azithromycin and tessalon Perles without significant improvement. Covid negative. She is unsure if her symptoms are related to allergies. She has been taking cetirizine-D, changed to regular strength and added flonase nasal spray. Normal CXR at [facility]. Denies shortness of breath, wheezing.    09/26/2019 Patient contacted today for acute televisit. Reports that she has had a cough since the end of March. Her cough is mostly dry. No significant mucus production, sinus or chest congestion. She is currently using flonase nasal spray and Zyrtec. She has been using phenergan cough syrup at night some with some improvement. She wakes up at night with cough around 2am. She does have heart burn and will take Rolaids. Denies shortness of breath, wheezing, leg swelling.   10/02/2019 Patient contacted today for acute teleivist. Her last televisit was 6 days ago for symptoms consistent with upper airway cough syndrome with associated GERD. She continues to have a persistent dry cough. States that her cough has improved some but hasn't resolved. She has been taking tessalon perle during the day and promethazine at bedtime. She took famotidine once but reports  that she doesn't like taking a lot of medication at once so she stopped taking it. She does not have any significant chest congestion, post nasal drip or purulent sinus congestion. Reports that she had a normal CXR at [facility]   Observations/Objective:  - No shortness of breath, wheezing or cough noted during phone conversation   Assessment and Plan:  Chronic cough: - Continues to be consistent with UPPER AIRWAY COUGH d/t PND and GERD - Recommend patient resume Famotidine 20mg  daily - Continue flonase nasal spray once daily and OTC antihistamine - Continue Tessalon perles 200mg  BID prn cough - Continue Promethazine 56ml at bedtime  - CXR at [facility] normal  - Check spirometry and FENO at next visit   Follow Up Instructions:   - Follow-up in 1 months with Dr. Carlis Abbott  I discussed the assessment and treatment plan with the patient. The patient was provided an opportunity to ask questions and all were answered. The patient agreed with the plan and demonstrated an understanding of the instructions.   The patient was advised to call back or seek an in-person evaluation if the symptoms worsen or if the condition fails to improve as anticipated.  I provided 22 minutes of non-face-to-face time during this encounter.   [name], NP

## 2019-10-03 ENCOUNTER — Telehealth: Payer: Self-pay | Admitting: Primary Care

## 2019-10-03 MED ORDER — FAMOTIDINE 20 MG PO TABS: 20.0000 mg | ORAL_TABLET | Freq: Every day | ORAL | 0 refills | Status: DC

## 2019-10-03 NOTE — Telephone Encounter (Signed)
Spoke with pt. She was confused about what kind of Pepcid Beth wanted her to take. Advised her per Beth's instructions she is to take Pepcid 20mg  OTC daily. Pt requested that we send in a prescription, it will be cheaper through her insurance. This has been taken care of. Nothing further was needed.

## 2019-10-06 ENCOUNTER — Ambulatory Visit
Admission: RE | Admit: 2019-10-06 | Discharge: 2019-10-06 | Disposition: A | Discharge status: Home / Self Care | Payer: Medicare HMO | Source: Ambulatory Visit | Attending: Family Medicine | Admitting: Family Medicine

## 2019-10-06 ENCOUNTER — Other Ambulatory Visit: Payer: Self-pay

## 2019-10-06 DIAGNOSIS — R928 Other abnormal and inconclusive findings on diagnostic imaging of breast: Secondary | ICD-10-CM

## 2019-10-10 ENCOUNTER — Telehealth: Payer: Self-pay | Admitting: Critical Care Medicine

## 2019-10-10 NOTE — Telephone Encounter (Signed)
Called and spoke with pt letting her know that Dr. Carlis Abbott said to schedule an in-person appt and she verbalized understanding. appt scheduled with Beth Thurs 5/6. Nothing further needed.

## 2019-10-10 NOTE — Telephone Encounter (Signed)
We need to see her in person to examine her and possibly get a CXR if 2 previous televisits have not helped resolve the issue.  Julian Hy, DO 10/10/19 9:35 AM [facility]

## 2019-10-10 NOTE — Telephone Encounter (Signed)
Called and spoke with pt who states she is still coughing. Pt state the cough is better but she is still having issues with the cough.  Pt is taking the benzonatate prn for the cough and states she is taking the promethazine cough syrup at night to help out as well.  Pt states she is coughing up some phlegm that is usually clear-yellow in color. Pt states that her cough is worse at night when she is going to lie down and states yesterday 5/3 her cough was bad in the morning. States this morning 5/4 she has not had any issues like she did yesterday morning.  Asked pt if she has been running a temp and pt states she did not think she had been but she states that at times she has felt hot. Pt has not had any complaints of body aches or chills. Pt denies any complaints of tightness in chest but states at times when she coughs it might sound like she is wheezing.  Dr. Carlis Abbott, please advise recommendations for pt as she states she has had two previous televisits with [facility] and wants to know if anything else could be done/prescribed.

## 2019-10-12 ENCOUNTER — Other Ambulatory Visit: Payer: Self-pay

## 2019-10-12 ENCOUNTER — Other Ambulatory Visit (INDEPENDENT_AMBULATORY_CARE_PROVIDER_SITE_OTHER): Payer: Medicare HMO

## 2019-10-12 ENCOUNTER — Telehealth: Payer: Self-pay | Admitting: Primary Care

## 2019-10-12 ENCOUNTER — Encounter: Payer: Self-pay | Admitting: Primary Care

## 2019-10-12 ENCOUNTER — Ambulatory Visit: Payer: Medicare HMO | Admitting: Primary Care

## 2019-10-12 VITALS — BP 108/78 | HR 84 | Temp 97.3°F | Ht 64.0 in | Wt 136.4 lb

## 2019-10-12 DIAGNOSIS — R053 Chronic cough: Secondary | ICD-10-CM

## 2019-10-12 DIAGNOSIS — R350 Frequency of micturition: Secondary | ICD-10-CM

## 2019-10-12 DIAGNOSIS — R05 Cough: Secondary | ICD-10-CM

## 2019-10-12 LAB — URINALYSIS, ROUTINE W REFLEX MICROSCOPIC
Bilirubin Urine: NEGATIVE
Ketones, ur: NEGATIVE
Leukocytes,Ua: NEGATIVE
Nitrite: NEGATIVE
Specific Gravity, Urine: 1.015 (ref 1.000–1.030)
Total Protein, Urine: NEGATIVE
Urine Glucose: NEGATIVE
Urobilinogen, UA: 0.2 (ref 0.0–1.0)
WBC, UA: NONE SEEN (ref 0–?)
pH: 5.5 (ref 5.0–8.0)

## 2019-10-12 MED ORDER — PREDNISONE 10 MG PO TABS: ORAL_TABLET | ORAL | 0 refills | Status: DC

## 2019-10-12 NOTE — Patient Instructions (Addendum)
Recommend: Continue Flonase nasal spray  Continue Pepcid at bedtime  Resume Claritin OR Zyrtec without decongestant   Rx: Prednisone taper (40mg  x 2 days; 30mg  x 2 days; 20mg  x 2 days; 10mg  x 2 days)  Orders: Covid testing Urinary culture  Follow-up: May 17th with Dr. Carlis Abbott

## 2019-10-12 NOTE — Assessment & Plan Note (Addendum)
-   Symptoms continue to be most consistent with post viral cough or UACS - Lungs are clear on exam throughout, O2 96% RA. Frequent throat clearing.  - Continue Flonase nasal spray 1 spray per nostril daily  - Continue Famotidine 20mg  at bedtime  - Resume OTC antihistamine WITHOUT decongestant  - Rx Prednisone taper (40mg  x 2 days; 30mg  x 2 days; 20mg  x 2 days; 10mg  x 2 days) - Repeat Covid testing; will request previous CXR from [facility] if unable then needs repeat  - FU with Spirometry and FENO May 17th

## 2019-10-12 NOTE — Telephone Encounter (Signed)
Can you see if you can get a report of her CXR that she had done at [facility]? If not then she needs to repeat CXR - please order at location convenient to her

## 2019-10-12 NOTE — Assessment & Plan Note (Signed)
-   Report lower back pain and urinary frequency  - Check UA/urine culture

## 2019-10-12 NOTE — Progress Notes (Signed)
@Patient  ID: Belinda Mcmillan, female    DOB: 1950/07/22, 69 y.o.   MRN: [id number]  Chief Complaint  Patient presents with  . Follow-up    C/o cough not better-dry but occass. prod. clear,yellow.headache in am,scratchy throat    Referring provider: Enid Skeens., MD  HPI: 69 year old female, passive smoker. Patient of Dr. Carlis Abbott, seen for initial consult on 09/21/19 for cough x 2 weeks. She was previously prescribed prednisone and azithromycin and tessalon Perles without significant improvement. Covid negative. She is unsure if her symptoms are related to allergies. She has been taking cetirizine-D, changed to regular strength and added flonase nasal spray. Normal CXR at [facility]. Denies shortness of breath, wheezing.   Previous [facility] pulmonary encounters: 09/26/2019 Patient contacted today for acute televisit. Reports that she has had a cough since the end of March. Her cough is mostly dry. No significant mucus production, sinus or chest congestion. She is currently using flonase nasal spray and Zyrtec. She has been using phenergan cough syrup at night some with some improvement. She wakes up at night with cough around 2am. She does have heart burn and will take Rolaids. Denies shortness of breath, wheezing, leg swelling.   10/02/2019 Patient contacted today for acute teleivist. Her last televisit was 6 days ago for symptoms consistent with upper airway cough syndrome with associated GERD. She continues to have a persistent dry cough. States that her cough has improved some but hasn't resolved. She has been taking tessalon perle during the day and promethazine at bedtime. She took famotidine once but reports that she doesn't like taking a lot of medication at once so she stopped taking it. She does not have any significant chest congestion, post nasal drip or purulent sinus congestion. Reports that she had a normal CXR at [facility].   10/12/2019 Patient presents today for acute visit.  She has had a chronic cough since early April. Originally treated with azithromycin and prednisone course. Feels steroid may have helped. She did have a low fever when she was first sick. Cough is mostly dry, occasionally productive. Wakes up with morning headache. Flonase has helped, sinuses are tender but no pressure. Throat is uncomfortable. She had a caker sore last week. She has some mild chronic GI symptoms which she is prescribed a probiotic for but not currently taking. She has urinary frequency and lower back pain. She has hx or kidney stone. Denies shortness of breath, wheezing, change in smell or taste. She was tested for covid 4 weeks ago which was negative. She has not had her covid vaccine because she has not been feeling well. No known hx of asthma. Ordered for spirometry and FENO. She has a follow-up scheduled on [place] 17th.    Allergies  Allergen Reactions  . Penicillins     Immunization History  Administered Date(s) Administered  . Pneumococcal Conjugate-13 12/13/2017    Past Medical History:  Diagnosis Date  . Allergy   . Diabetes mellitus June 2012   T2DM  . Hyperlipidemia   . Hypertension     Tobacco History: Social History   Tobacco Use  Smoking Status Passive Smoke Exposure - Never Smoker  Smokeless Tobacco Never Used   Counseling given: Not Answered   Outpatient Medications Prior to Visit  Medication Sig Dispense Refill  . clotrimazole (MYCELEX) 10 MG troche Take 10 mg by mouth 5 (five) times daily.    . famotidine (PEPCID) 20 MG tablet Take 1 tablet (20 mg total) by mouth  at bedtime. 30 tablet 0  . fluticasone (FLONASE) 50 MCG/ACT nasal spray Place 2 sprays into both nostrils daily. 16 g 11  . losartan (COZAAR) 100 MG tablet Take 100 mg by mouth daily.    . pravastatin (PRAVACHOL) 40 MG tablet Take 1 tablet (40 mg total) by mouth daily. 30 tablet 1  . promethazine (PHENERGAN) 6.25 MG/5ML syrup Take 5 mLs (6.25 mg total) by mouth at bedtime as needed  (cough at bedtime). 120 mL 0   No facility-administered medications prior to visit.    Review of Systems  Review of Systems  Constitutional: Negative for chills and fever.  HENT: Positive for postnasal drip. Negative for congestion, sinus pressure and sinus pain.   Respiratory: Positive for cough. Negative for choking, chest tightness, shortness of breath, wheezing and stridor.   Cardiovascular: Negative for leg swelling.    Physical Exam  BP 108/78 (BP Location: Left Arm, Cuff Size: Normal)   Pulse 84   Temp (!) 97.3 F (36.3 C) (Temporal)   Ht 5\' 4"  (1.626 m)   Wt 136 lb 6.4 oz (61.9 kg)   SpO2 96%   BMI 23.41 kg/m  Physical Exam Constitutional:      General: She is not in acute distress.    Appearance: Normal appearance. She is normal weight. She is not ill-appearing.  HENT:     Right Ear: Tympanic membrane and ear canal normal. There is no impacted cerumen.     Left Ear: Tympanic membrane and ear canal normal. There is no impacted cerumen.     Ears:     Comments: Bilateral hearing aids    Mouth/Throat:     Mouth: Mucous membranes are moist.     Pharynx: Oropharynx is clear.  Cardiovascular:     Rate and Rhythm: Normal rate and regular rhythm.  Pulmonary:     Effort: Pulmonary effort is normal. No respiratory distress.     Breath sounds: No stridor. No wheezing, rhonchi or rales.     Comments: Lungs clear. Frequent throat clearing.  Musculoskeletal:     Cervical back: Normal range of motion and neck supple.  Skin:    General: Skin is warm and dry.  Neurological:     Mental Status: She is alert.  Psychiatric:        Mood and Affect: Mood normal.        Behavior: Behavior normal.        Thought Content: Thought content normal.        Judgment: Judgment normal.      Lab Results:  CBC No results found for: WBC, RBC, HGB, HCT, PLT, MCV, MCH, MCHC, RDW, LYMPHSABS, MONOABS, EOSABS, BASOSABS  BMET No results found for: NA, K, CL, CO2, GLUCOSE, BUN,  CREATININE, CALCIUM, GFRNONAA, GFRAA  BNP No results found for: BNP  ProBNP No results found for: PROBNP  Imaging: US BREAST LTD UNI LEFT INC AXILLA  Result Date: 10/06/2019 CLINICAL DATA:  Possible mass left breast identified on recent screening mammogram. EXAM: DIGITAL DIAGNOSTIC LEFT MAMMOGRAM WITH TOMO ULTRASOUND LEFT BREAST COMPARISON:  September 25, 2019 ACR Breast Density Category b: There are scattered areas of fibroglandular density. FINDINGS: Spot compression views confirm a circumscribed subcentimeter oval mass in the medial periareolar left breast. Targeted ultrasound is performed, showing a 0.4 x 0.4 x 0.4 cm simple cyst with posterior acoustic enhancement at 9 o'clock position 2 cm from the nipple. No internal color Doppler flow. This corresponds to the mammographically detected mass. IMPRESSION: Benign 0.4  cm cyst 9 o'clock left breast. No evidence of malignancy. RECOMMENDATION: Screening mammogram in one year.(Code:SM-B-01Y) I have discussed the findings and recommendations with the patient. If applicable, a reminder letter will be sent to the patient regarding the next appointment. BI-RADS CATEGORY  2: Benign. Electronically Signed   By: Curlene Dolphin M.D.   On: 10/06/2019 14:34   MM DIAG BREAST TOMO UNI LEFT  Result Date: 10/06/2019 CLINICAL DATA:  Possible mass left breast identified on recent screening mammogram. EXAM: DIGITAL DIAGNOSTIC LEFT MAMMOGRAM WITH TOMO ULTRASOUND LEFT BREAST COMPARISON:  September 25, 2019 ACR Breast Density Category b: There are scattered areas of fibroglandular density. FINDINGS: Spot compression views confirm a circumscribed subcentimeter oval mass in the medial periareolar left breast. Targeted ultrasound is performed, showing a 0.4 x 0.4 x 0.4 cm simple cyst with posterior acoustic enhancement at 9 o'clock position 2 cm from the nipple. No internal color Doppler flow. This corresponds to the mammographically detected mass. IMPRESSION: Benign 0.4 cm cyst 9  o'clock left breast. No evidence of malignancy. RECOMMENDATION: Screening mammogram in one year.(Code:SM-B-01Y) I have discussed the findings and recommendations with the patient. If applicable, a reminder letter will be sent to the patient regarding the next appointment. BI-RADS CATEGORY  2: Benign. Electronically Signed   By: Curlene Dolphin M.D.   On: 10/06/2019 14:34   MM 3D SCREEN BREAST BILATERAL  Result Date: 09/25/2019 CLINICAL DATA:  Screening. EXAM: DIGITAL SCREENING BILATERAL MAMMOGRAM WITH TOMO AND CAD COMPARISON:  Previous exam(s). ACR Breast Density Category b: There are scattered areas of fibroglandular density. FINDINGS: In the left breast, a possible mass warrants further evaluation. In the right breast, no findings suspicious for malignancy. Images were processed with CAD. IMPRESSION: Further evaluation is suggested for possible mass in the left breast. RECOMMENDATION: Diagnostic mammogram and possibly ultrasound of the left breast. (Code:FI-L-55M) BI-RADS CATEGORY  0: Incomplete. Need additional imaging evaluation and/or prior mammograms for comparison. Electronically Signed   By: Lovey Newcomer M.D.   On: 09/25/2019 15:34     Assessment & Plan:   Chronic cough - Symptoms continue to be most consistent with post viral cough or UACS - Lungs are clear on exam throughout, O2 96% RA. Frequent throat clearing.  - Continue Flonase nasal spray 1 spray per nostril daily  - Continue Famotidine 20mg  at bedtime  - Resume OTC antihistamine WITHOUT decongestant  - Rx Prednisone taper (40mg  x 2 days; 30mg  x 2 days; 20mg  x 2 days; 10mg  x 2 days) - Repeat Covid testing; will request previous CXR from [facility] if unable then needs repeat  - FU with Spirometry and FENO May 17th       [name], NP 10/12/2019

## 2019-10-13 LAB — URINE CULTURE
MICRO NUMBER:: 10447440
Result:: NO GROWTH
SPECIMEN QUALITY:: ADEQUATE

## 2019-10-13 NOTE — Progress Notes (Signed)
Please let patient know urine culture was negative

## 2019-10-16 ENCOUNTER — Ambulatory Visit: Payer: Medicare HMO | Attending: Internal Medicine

## 2019-10-16 DIAGNOSIS — Z20822 Contact with and (suspected) exposure to covid-19: Secondary | ICD-10-CM

## 2019-10-16 NOTE — Telephone Encounter (Signed)
Called and spoke with [facility] regarding patient's CXR I sent a fax for request to get a copy and send a CD to our office.

## 2019-10-17 ENCOUNTER — Other Ambulatory Visit: Payer: Medicare HMO

## 2019-10-17 LAB — SARS-COV-2, NAA 2 DAY TAT

## 2019-10-17 LAB — NOVEL CORONAVIRUS, NAA: SARS-CoV-2, NAA: NOT DETECTED

## 2019-10-19 ENCOUNTER — Telehealth: Payer: Self-pay | Admitting: Critical Care Medicine

## 2019-10-19 NOTE — Telephone Encounter (Signed)
Who called? I do not see that a call was placed to the pt- LMTCB x 1

## 2019-10-19 NOTE — Telephone Encounter (Signed)
Patient is returning phone call. States does not need to call her back. [facility] called her with Covid 19 test results.

## 2019-10-23 ENCOUNTER — Ambulatory Visit: Payer: Self-pay | Admitting: Critical Care Medicine

## 2019-10-24 ENCOUNTER — Encounter: Payer: Self-pay | Admitting: Primary Care

## 2019-10-24 ENCOUNTER — Other Ambulatory Visit: Payer: Self-pay

## 2019-10-24 ENCOUNTER — Other Ambulatory Visit: Payer: Self-pay | Admitting: Primary Care

## 2019-10-24 ENCOUNTER — Ambulatory Visit: Payer: Medicare HMO | Admitting: Primary Care

## 2019-10-24 ENCOUNTER — Ambulatory Visit (INDEPENDENT_AMBULATORY_CARE_PROVIDER_SITE_OTHER): Payer: Medicare HMO

## 2019-10-24 VITALS — BP 110/62 | HR 81 | Temp 97.2°F | Ht 65.0 in | Wt 138.2 lb

## 2019-10-24 DIAGNOSIS — R053 Chronic cough: Secondary | ICD-10-CM

## 2019-10-24 DIAGNOSIS — R739 Hyperglycemia, unspecified: Secondary | ICD-10-CM | POA: Diagnosis not present

## 2019-10-24 DIAGNOSIS — R05 Cough: Secondary | ICD-10-CM

## 2019-10-24 LAB — GLUCOSE, POCT (MANUAL RESULT ENTRY): POC Glucose: 149 mg/dl — AB (ref 70–99)

## 2019-10-24 MED ORDER — DOXYCYCLINE HYCLATE 100 MG PO TABS: 100.0000 mg | ORAL_TABLET | Freq: Two times a day (BID) | ORAL | 0 refills | Status: DC

## 2019-10-24 NOTE — Progress Notes (Signed)
Please let patient know CXR showed no acute process in chest

## 2019-10-24 NOTE — Progress Notes (Signed)
@Patient  ID: Belinda Mcmillan, female    DOB: [birth year], 69 y.o.   MRN: [id number]  Chief Complaint  Patient presents with  . Follow-up    Cough has improved     Referring provider: Enid Skeens., MD  HPI: 69 year old female, passive smoker. Patient of Dr. Carlis Abbott, seen for initial consult on 09/21/19 for cough x 2 weeks. She was previously prescribed prednisone and azithromycin and tessalon Perles without significant improvement. Covid negative. She is unsure if her symptoms are related to allergies. She has been taking cetirizine-D, changed to regular strength and added flonase nasal spray. Normal CXR at [facility]. Denies shortness of breath, wheezing.   Previous [facility] pulmonary encounters: 09/26/2019 Patient contacted today for acute televisit. Reports that she has had a cough since the end of March. Her cough is mostly dry. No significant mucus production, sinus or chest congestion. She is currently using flonase nasal spray and Zyrtec. She has been using phenergan cough syrup at night some with some improvement. She wakes up at night with cough around 2am. She does have heart burn and will take Rolaids. Denies shortness of breath, wheezing, leg swelling.   10/02/2019 Patient contacted today for acute teleivist. Her last televisit was 6 days ago for symptoms consistent with upper airway cough syndrome with associated GERD. She continues to have a persistent dry cough. States that her cough has improved some but hasn't resolved. She has been taking tessalon perle during the day and promethazine at bedtime. She took famotidine once but reports that she doesn't like taking a lot of medication at once so she stopped taking it. She does not have any significant chest congestion, post nasal drip or purulent sinus congestion. Reports that she had a normal CXR at [facility]   10/12/2019 Patient presents today for acute visit. She has had a chronic cough since early April. Originally treated  with azithromycin and prednisone course. Feels steroid may have helped. She did have a low fever when she was first sick. Cough is mostly dry, occasionally productive. Wakes up with morning headache. Flonase has helped, sinuses are tender but no pressure. Throat is uncomfortable. She had a caker sore last week. She has some mild chronic GI symptoms which she is prescribed a probiotic for but not currently taking. She has urinary frequency and lower back pain. She has hx or kidney stone. Denies shortness of breath, wheezing, change in smell or taste. She was tested for covid 4 weeks ago which was negative. She has not had her covid vaccine because she has not been feeling well. No known hx of asthma. Ordered for spirometry and FENO. She has a follow-up scheduled on [place] 17th.   10/24/2019 Patient presents today for 2 week follow-up. She was origianlly supposed to see Dr. Carlis Abbott yesterday but this visit was rescheduled to today. She did not end up having Spirometry or FENO. States that she did not take prednisone d/t her hx of diabetes. Her A1C has been running high. Checked today in office and was <150. She continues to have chronic cough but states that it is some better since last visit. She has been taking Famotidine 20mg  at bedtime for GERD symptoms. She is requesting course of antibiotics to "knock" cough out. Repeat COVID test was negative. Urine culture negative. We requested CXR be faxed from [facility], have not received. We will repeat today.   Allergies  Allergen Reactions  . Penicillins     Immunization History  Administered Date(s)  Administered  . Pneumococcal Conjugate-13 12/13/2017    Past Medical History:  Diagnosis Date  . Allergy   . Diabetes mellitus June 2012   T2DM  . Hyperlipidemia   . Hypertension     Tobacco History: Social History   Tobacco Use  Smoking Status Passive Smoke Exposure - Never Smoker  Smokeless Tobacco Never Used   Counseling given: Not  Answered   Outpatient Medications Prior to Visit  Medication Sig Dispense Refill  . famotidine (PEPCID) 20 MG tablet Take 1 tablet (20 mg total) by mouth at bedtime. 30 tablet 0  . fluticasone (FLONASE) 50 MCG/ACT nasal spray Place 2 sprays into both nostrils daily. 16 g 11  . losartan (COZAAR) 100 MG tablet Take 100 mg by mouth daily.    . promethazine (PHENERGAN) 6.25 MG/5ML syrup Take 5 mLs (6.25 mg total) by mouth at bedtime as needed (cough at bedtime). 120 mL 0  . clotrimazole (MYCELEX) 10 MG troche Take 10 mg by mouth 5 (five) times daily.    . pravastatin (PRAVACHOL) 40 MG tablet Take 1 tablet (40 mg total) by mouth daily. 30 tablet 1  . predniSONE (DELTASONE) 10 MG tablet Take 4 tabs po daily x 2 days; then 3 tabs for 2 days; then 2 tabs for 2 days; then 1 tab for 2 days (Patient not taking: Reported on 10/24/2019) 20 tablet 0   No facility-administered medications prior to visit.    Review of Systems  Review of Systems  Respiratory: Positive for cough.    Physical Exam  BP 110/62 (BP Location: Left Arm, Cuff Size: Normal)   Pulse 81   Temp (!) 97.2 F (36.2 C) (Temporal)   Ht 5\' 5"  (1.651 m)   Wt 138 lb 3.2 oz (62.7 kg)   SpO2 100%   BMI 23.00 kg/m  Physical Exam Constitutional:      Appearance: Normal appearance.  HENT:     Head: Normocephalic and atraumatic.     Mouth/Throat:     Mouth: Mucous membranes are moist.     Pharynx: Oropharynx is clear.  Cardiovascular:     Rate and Rhythm: Normal rate and regular rhythm.  Pulmonary:     Effort: Pulmonary effort is normal.     Breath sounds: Normal breath sounds.  Skin:    General: Skin is warm and dry.  Neurological:     General: No focal deficit present.     Mental Status: She is alert and oriented to person, place, and time. Mental status is at baseline.  Psychiatric:        Mood and Affect: Mood normal.        Behavior: Behavior normal.        Thought Content: Thought content normal.        Judgment:  Judgment normal.     Lab Results:  CBC No results found for: WBC, RBC, HGB, HCT, PLT, MCV, MCH, MCHC, RDW, LYMPHSABS, MONOABS, EOSABS, BASOSABS  BMET No results found for: NA, K, CL, CO2, GLUCOSE, BUN, CREATININE, CALCIUM, GFRNONAA, GFRAA  BNP No results found for: BNP  ProBNP No results found for: PROBNP  Imaging: DG Chest 2 View  Result Date: 10/24/2019 CLINICAL DATA:  Cough EXAM: CHEST - 2 VIEW COMPARISON:  2010 FINDINGS: The heart size and mediastinal contours are within normal limits. No consolidation or edema. No pleural effusion. No acute osseous abnormality. IMPRESSION: No acute process in the chest. Electronically Signed   By: Macy Mis M.D.   On: 10/24/2019  11:51   US BREAST LTD UNI LEFT INC AXILLA  Result Date: 10/06/2019 CLINICAL DATA:  Possible mass left breast identified on recent screening mammogram. EXAM: DIGITAL DIAGNOSTIC LEFT MAMMOGRAM WITH TOMO ULTRASOUND LEFT BREAST COMPARISON:  September 25, 2019 ACR Breast Density Category b: There are scattered areas of fibroglandular density. FINDINGS: Spot compression views confirm a circumscribed subcentimeter oval mass in the medial periareolar left breast. Targeted ultrasound is performed, showing a 0.4 x 0.4 x 0.4 cm simple cyst with posterior acoustic enhancement at 9 o'clock position 2 cm from the nipple. No internal color Doppler flow. This corresponds to the mammographically detected mass. IMPRESSION: Benign 0.4 cm cyst 9 o'clock left breast. No evidence of malignancy. RECOMMENDATION: Screening mammogram in one year.(Code:SM-B-01Y) I have discussed the findings and recommendations with the patient. If applicable, a reminder letter will be sent to the patient regarding the next appointment. BI-RADS CATEGORY  2: Benign. Electronically Signed   By: Curlene Dolphin M.D.   On: 10/06/2019 14:34   MM DIAG BREAST TOMO UNI LEFT  Result Date: 10/06/2019 CLINICAL DATA:  Possible mass left breast identified on recent screening  mammogram. EXAM: DIGITAL DIAGNOSTIC LEFT MAMMOGRAM WITH TOMO ULTRASOUND LEFT BREAST COMPARISON:  September 25, 2019 ACR Breast Density Category b: There are scattered areas of fibroglandular density. FINDINGS: Spot compression views confirm a circumscribed subcentimeter oval mass in the medial periareolar left breast. Targeted ultrasound is performed, showing a 0.4 x 0.4 x 0.4 cm simple cyst with posterior acoustic enhancement at 9 o'clock position 2 cm from the nipple. No internal color Doppler flow. This corresponds to the mammographically detected mass. IMPRESSION: Benign 0.4 cm cyst 9 o'clock left breast. No evidence of malignancy. RECOMMENDATION: Screening mammogram in one year.(Code:SM-B-01Y) I have discussed the findings and recommendations with the patient. If applicable, a reminder letter will be sent to the patient regarding the next appointment. BI-RADS CATEGORY  2: Benign. Electronically Signed   By: Curlene Dolphin M.D.   On: 10/06/2019 14:34   MM 3D SCREEN BREAST BILATERAL  Result Date: 09/25/2019 CLINICAL DATA:  Screening. EXAM: DIGITAL SCREENING BILATERAL MAMMOGRAM WITH TOMO AND CAD COMPARISON:  Previous exam(s). ACR Breast Density Category b: There are scattered areas of fibroglandular density. FINDINGS: In the left breast, a possible mass warrants further evaluation. In the right breast, no findings suspicious for malignancy. Images were processed with CAD. IMPRESSION: Further evaluation is suggested for possible mass in the left breast. RECOMMENDATION: Diagnostic mammogram and possibly ultrasound of the left breast. (Code:[id number]-79M) BI-RADS CATEGORY  0: Incomplete. Need additional imaging evaluation and/or prior mammograms for comparison. Electronically Signed   By: Lovey Newcomer M.D.   On: 09/25/2019 15:34     Assessment & Plan:   Chronic cough - Patient has had a cough for 6 weeks, she has finally had some improvement with addition of Famotidine for suspected underlying GERD symptoms.  - I  think it is reasonable to try a course of doxycycline and prednisone 20mg  x 5 days for bronchitis symptoms. She was originally treated with azithromycin back in April. She did reported some improvement with steroids, she did not take prednisone recommended at last visit d/t hx diabetes. BS today was <150.  - CXR 10/24/2019 showed no acute process in the chest - Continue Flonase nasal spray 1 spray per nostril daily; Famotidine 20mg  at bedtime and OTC antihistamine WITHOUT decongestant  - She needs to follow-up eventually with spirometry and FENO to rule out underlying obstructive/restrictive lung disease     Benjamine Mola  Teresita Madura, NP 10/24/2019

## 2019-10-24 NOTE — Assessment & Plan Note (Addendum)
-   Patient has had a cough for 6 weeks, she has finally had some improvement with addition of Famotidine for suspected underlying GERD symptoms.  - I think it is reasonable to try a course of doxycycline and prednisone 20mg  x 5 days for bronchitis symptoms. She was originally treated with azithromycin back in April. She did reported some improvement with steroids, she did not take prednisone recommended at last visit d/t hx diabetes. BS today was <150.  - CXR 10/24/2019 showed no acute process in the chest - Continue Flonase nasal spray 1 spray per nostril daily; Famotidine 20mg  at bedtime and OTC antihistamine WITHOUT decongestant  - She needs to follow-up eventually with spirometry and FENO to rule out underlying obstructive/restrictive lung disease

## 2019-10-24 NOTE — Patient Instructions (Addendum)
Cough recommendations:  - Take 2 tablets prednisone x 5 days - Continue Flonase nasal spray 1 spray per nostril daily  - Continue Famotidine 20mg  at bedtime  - Continue antihistamine WITHOUT decongestant   Dry mouth: - Use biotene mouth was twice daily   Orders: - CXR today (after results will call and based on this will send in rx) - POC blood glucose   Follow-up: - 6-8 weeks with Spirometry/FENO Dr. Carlis Abbott

## 2019-10-25 ENCOUNTER — Other Ambulatory Visit: Payer: Self-pay | Admitting: *Deleted

## 2019-10-25 ENCOUNTER — Telehealth: Payer: Self-pay | Admitting: *Deleted

## 2019-10-25 ENCOUNTER — Other Ambulatory Visit: Payer: Self-pay | Admitting: Primary Care

## 2019-10-25 NOTE — Telephone Encounter (Signed)
Im out of office until next week, this needed to come from her PCP. I did not prescribe. Thank you

## 2019-11-09 ENCOUNTER — Telehealth: Payer: Self-pay | Admitting: Critical Care Medicine

## 2019-11-09 NOTE — Telephone Encounter (Signed)
Outside CXR reviewed from [place] on 09/19/19 and internal CXR on 10/24/19. Likely LLL opacity causing a lateral retrocardiac opacity. Given steroids and antibiotics in May. Needs follow up in June with 2 view CXR to ensure resolution.  Julian Hy, DO 11/09/19 5:15 PM [facility]

## 2019-11-10 NOTE — Telephone Encounter (Signed)
Patient is returning phone call. Patient phone number is [phone].

## 2019-11-10 NOTE — Telephone Encounter (Signed)
LMTCB and will hold in triage since PC initiated msg

## 2019-11-10 NOTE — Telephone Encounter (Signed)
Spoke with the pt and notified per Dr Carlis Abbott- Outside CXR reviewed from [place] on 09/19/19 and internal CXR on 10/24/19. Likely LLL opacity causing a lateral retrocardiac opacity. Given steroids and antibiotics in May. Needs follow up in June with 2 view CXR to ensure resolution.  She verbalized understanding and will keep appt to see SG on 11/24/19 with cxr

## 2019-11-10 NOTE — Telephone Encounter (Signed)
Patient would like to discuss appointment to be scheduled with Dr. Carlis Abbott in June 2021. Patient phone number is [phone]

## 2019-11-10 NOTE — Telephone Encounter (Signed)
ATC patient about scheduling her an appointment with Dr. Carlis Abbott or APP [facility]  If patient calls back the only opening Dr. Carlis Abbott has is on 6/29 or any of the APP that works with her schedule

## 2019-11-24 ENCOUNTER — Other Ambulatory Visit: Payer: Self-pay

## 2019-11-24 ENCOUNTER — Ambulatory Visit (INDEPENDENT_AMBULATORY_CARE_PROVIDER_SITE_OTHER)
Admission: RE | Admit: 2019-11-24 | Discharge: 2019-11-24 | Disposition: A | Discharge status: Home / Self Care | Payer: Medicare HMO | Source: Ambulatory Visit | Attending: Acute Care | Admitting: Acute Care

## 2019-11-24 ENCOUNTER — Encounter: Payer: Self-pay | Admitting: Acute Care

## 2019-11-24 ENCOUNTER — Other Ambulatory Visit: Payer: Medicare HMO

## 2019-11-24 ENCOUNTER — Ambulatory Visit: Payer: Medicare HMO | Admitting: Acute Care

## 2019-11-24 VITALS — BP 110/72 | HR 74 | Temp 99.2°F | Ht 65.0 in | Wt 138.0 lb

## 2019-11-24 DIAGNOSIS — R05 Cough: Secondary | ICD-10-CM | POA: Diagnosis not present

## 2019-11-24 DIAGNOSIS — R053 Chronic cough: Secondary | ICD-10-CM

## 2019-11-24 NOTE — Patient Instructions (Addendum)
It is good to see you today. We will send you to the [facility] office today for follow up CXR.  We will call you with the results. Follow up Spirometry and FENO as is scheduled July 2.  Covid Test 6/29. Continue Flonase nasal spray1 spray per nostril daily;Famotidine 20mg at bedtime and Zyrtec daily Follow up with Dr. Carlis Abbott 7/7 at 12 noon as is scheduled.  Please contact office for sooner follow up if symptoms do not improve or worsen or seek emergency care

## 2019-11-24 NOTE — Progress Notes (Signed)
History of Present Illness Belinda Mcmillan is a 69 y.o. female never smoker with passive smoke exposure . She is followed by Dr. Carlis Abbott.   69 year old female, passive smoker. Patient of Dr. Carlis Abbott, seen for initial consult on 09/21/19 for cough x 2 weeks. She was previously prescribed prednisone and azithromycin and tessalon Perles without significant improvement. Covid negative. She is unsure if her symptoms are related to allergies. She has been taking cetirizine-D, changed to regular strength and added flonase nasal spray.    11/24/2019 Follow up LLL opacity per CXR 10/24/2019 ? / Cough which is better with Famotoiine .   Pt. Was treated for LLL opacity noted on review of CXR done both 09/19/2019 ans 10/24/2019.  Outside CXR reviewed from [place] on 09/19/19 and internal CXR on 10/24/19. Likely LLL opacity causing a lateral retrocardiac opacity. Given steroids and antibiotics in May. Radiology  recommended   follow up in June with 2 view CXR to ensure resolution. ( although 5/18 CXR was read as no acute process) She was treated  with steroids and antibiotics for continued symptoms of bronchitis on 5/18.Marland Kitchen She was treated with Doxycycline and prednisone 20 mg x 5 days. She did not take the medications when they were originally  prescribed, as her blood sugars were running high. She did take the prednisone and Doxycycline starting June 5th. She has completed both. She states she does feel better. Her cough is almost gone after the prednisone. Secretions are clear. She denies any fever. She is scheduled for Spirometry and FENO 11/08/2019.She wondered if she needed to do these, and I confirmed that she should for baseline information.  Test Results:  CXR 618/2021>> follow up for Likely LLL opacity causing a lateral retrocardiac opacity Lung volumes are normal. No consolidative airspace disease. No pleural effusions. No pneumothorax. No pulmonary nodule or mass noted. Pulmonary vasculature and the  cardiomediastinal silhouette are within normal limits. Atherosclerosis in the thoracic aorta. Surgical clips project over the right upper quadrant of the abdomen, likely from prior cholecystectomy.  IMPRESSION: 1.  No radiographic evidence of acute cardiopulmonary disease. 2. Aortic atherosclerosis.  CXR 10/24/2019 The heart size and mediastinal contours are within normal limits. No consolidation or edema. No pleural effusion. No acute osseous abnormality.  IMPRESSION: No acute process in the chest.  No flowsheet data found.  No flowsheet data found.  BNP No results found for: BNP  ProBNP No results found for: PROBNP  PFT No results found for: FEV1PRE, FEV1POST, FVCPRE, FVCPOST, TLC, DLCOUNC, PREFEV1FVCRT, PSTFEV1FVCRT  No results found.   Past medical hx Past Medical History:  Diagnosis Date  . Allergy   . Diabetes mellitus June 2012   T2DM  . Hyperlipidemia   . Hypertension      Social History   Tobacco Use  . Smoking status: Passive Smoke Exposure - Never Smoker  . Smokeless tobacco: Never Used  Vaping Use  . Vaping Use: Never used  Substance Use Topics  . Alcohol use: No  . Drug use: No    [name] reports that she is a non-smoker but has been exposed to tobacco smoke. She has never used smokeless tobacco. She reports that she does not drink alcohol and does not use drugs.  Tobacco Cessation: Passive smoke exposure  Past surgical hx, Family hx, Social hx all reviewed.  Current Outpatient Medications on File Prior to Visit  Medication Sig  . atorvastatin (LIPITOR) 10 MG tablet atorvastatin 10 mg tablet  Take 1 tablet every  day by oral route.  . clotrimazole (MYCELEX) 10 MG troche Take 10 mg by mouth 5 (five) times daily.  . famotidine (PEPCID) 20 MG tablet Take 1 tablet (20 mg total) by mouth daily.  . fluticasone (FLONASE) 50 MCG/ACT nasal spray Place 2 sprays into both nostrils daily.  Marland Kitchen losartan (COZAAR) 100 MG tablet Take 100 mg by  mouth daily.  . promethazine (PHENERGAN) 6.25 MG/5ML syrup Take 5 mLs (6.25 mg total) by mouth at bedtime as needed (cough at bedtime).  . pravastatin (PRAVACHOL) 40 MG tablet Take 1 tablet (40 mg total) by mouth daily. (Patient not taking: Reported on 11/24/2019)  . predniSONE (DELTASONE) 10 MG tablet Take 4 tabs po daily x 2 days; then 3 tabs for 2 days; then 2 tabs for 2 days; then 1 tab for 2 days (Patient not taking: Reported on 10/24/2019)   No current facility-administered medications on file prior to visit.     Allergies  Allergen Reactions  . Penicillins     Review Of Systems:  Constitutional:   No  weight loss, night sweats,  Fevers, chills, fatigue, or  lassitude.  HEENT:   No headaches,  Difficulty swallowing,  Tooth/dental problems, or  Sore throat,                No sneezing, itching, ear ache, nasal congestion, post nasal drip,   CV:  No chest pain,  Orthopnea, PND, swelling in lower extremities, anasarca, dizziness, palpitations, syncope.   GI  No heartburn, indigestion, abdominal pain, nausea, vomiting, diarrhea, change in bowel habits, loss of appetite, bloody stools.   Resp: No shortness of breath with exertion or at rest.  No excess mucus, no productive cough,  No non-productive cough,  No coughing up of blood.  No change in color of mucus.  No wheezing.  No chest wall deformity  Skin: no rash or lesions.  GU: no dysuria, change in color of urine, no urgency or frequency.  No flank pain, no hematuria   MS:  No joint pain or swelling.  No decreased range of motion.  No back pain.  Psych:  No change in mood or affect. No depression or anxiety.  No memory loss.   Vital Signs BP 110/72 (BP Location: Right Arm, Cuff Size: Normal)   Pulse 74   Temp 99.2 F (37.3 C) (Oral)   Ht 5\' 5"  (1.651 m)   Wt 138 lb (62.6 kg)   SpO2 98%   BMI 22.96 kg/m    Physical Exam:  General- No distress,  A&Ox3, pleasant ENT: No sinus tenderness, TM clear, pale nasal mucosa, no  oral exudate,no post nasal drip, no LAN Cardiac: S1, S2, regular rate and rhythm, no murmur Chest: No wheeze/ rales/ dullness; no accessory muscle use, no nasal flaring, no sternal retractions Abd.: Soft Non-tender, ND, BS + Ext: No clubbing cyanosis, edema Neuro:  normal strength, Cranial nerves intact Skin: No rashes, No lesions warm and dry Psych: normal mood and behavior   Assessment/Plan Slow to resolve bronchitis Most likely multifactorial ( GERD, PND) Resolution of infiltrate Plan We will send you to the [facility] office today for follow up CXR.  We will call you with the results. Follow up Spirometry and FENO as is scheduled July 2 to establish  baseline  Covid Test 6/29. Continue Flonase nasal spray1 spray per nostril daily;Famotidine 20mg at bedtime and Zyrtec daily Follow up with Dr. Carlis Abbott 7/7 at 12 noon as is scheduled.  Please contact office for sooner follow  up if symptoms do not improve or worsen or seek emergency care     Magdalen Spatz, NP 11/24/2019  10:11 AM

## 2019-11-24 NOTE — Progress Notes (Signed)
Please call patient and let Belinda Mcmillan know Belinda Mcmillan CXR is clear.  Have Belinda Mcmillan follow up with Dr. Carlis Abbott as is scheduled after Belinda Mcmillan Spirometry and FENO testing.

## 2019-12-05 ENCOUNTER — Other Ambulatory Visit (HOSPITAL_COMMUNITY)
Admission: RE | Admit: 2019-12-05 | Discharge: 2019-12-05 | Disposition: A | Discharge status: Home / Self Care | Payer: Medicare HMO | Source: Ambulatory Visit | Attending: Critical Care Medicine | Admitting: Critical Care Medicine

## 2019-12-05 DIAGNOSIS — Z20822 Contact with and (suspected) exposure to covid-19: Secondary | ICD-10-CM | POA: Insufficient documentation

## 2019-12-05 DIAGNOSIS — Z01812 Encounter for preprocedural laboratory examination: Secondary | ICD-10-CM | POA: Insufficient documentation

## 2019-12-05 LAB — SARS CORONAVIRUS 2 (TAT 6-24 HRS): SARS Coronavirus 2: NEGATIVE

## 2019-12-07 ENCOUNTER — Encounter: Payer: Self-pay | Admitting: Acute Care

## 2019-12-08 ENCOUNTER — Other Ambulatory Visit: Payer: Self-pay

## 2019-12-08 ENCOUNTER — Ambulatory Visit: Payer: Medicare HMO | Admitting: Critical Care Medicine

## 2019-12-08 ENCOUNTER — Ambulatory Visit: Payer: Medicare HMO

## 2019-12-08 DIAGNOSIS — R05 Cough: Secondary | ICD-10-CM

## 2019-12-08 DIAGNOSIS — R053 Chronic cough: Secondary | ICD-10-CM

## 2019-12-08 LAB — NITRIC OXIDE: Nitric Oxide: 15

## 2019-12-13 ENCOUNTER — Encounter: Payer: Self-pay | Admitting: Critical Care Medicine

## 2019-12-13 ENCOUNTER — Other Ambulatory Visit: Payer: Self-pay

## 2019-12-13 ENCOUNTER — Ambulatory Visit: Payer: Medicare HMO | Admitting: Critical Care Medicine

## 2019-12-13 VITALS — BP 110/60 | HR 78 | Temp 98.6°F | Ht 64.0 in | Wt 139.8 lb

## 2019-12-13 DIAGNOSIS — J309 Allergic rhinitis, unspecified: Secondary | ICD-10-CM

## 2019-12-13 DIAGNOSIS — J4530 Mild persistent asthma, uncomplicated: Secondary | ICD-10-CM

## 2019-12-13 DIAGNOSIS — Z23 Encounter for immunization: Secondary | ICD-10-CM

## 2019-12-13 MED ORDER — MONTELUKAST SODIUM 10 MG PO TABS
10.0000 mg | ORAL_TABLET | Freq: Every day | ORAL | 11 refills | Status: DC
Start: 2019-12-13 — End: 2020-07-17

## 2019-12-13 MED ORDER — FLOVENT HFA 110 MCG/ACT IN AERO
2.0000 | INHALATION_SPRAY | Freq: Two times a day (BID) | RESPIRATORY_TRACT | 11 refills | Status: DC
Start: 2019-12-13 — End: 2019-12-19

## 2019-12-13 NOTE — Patient Instructions (Addendum)
Thank you for visiting Dr. Carlis Abbott at [facility]. We recommend the following:  Use albuterol rescue inhaler up to every 4 hours as needed (rescue inhaler).   Use Flovent 2 times per day, no matter how good or bad you feel (maintenance inhaler). Rinse your mouth after every use.  Meds ordered this encounter  Medications  . fluticasone (FLOVENT HFA) 110 MCG/ACT inhaler    Sig: Inhale 2 puffs into the lungs 2 (two) times daily.    Dispense:  1 Inhaler    Refill:  11  . montelukast (SINGULAIR) 10 MG tablet    Sig: Take 1 tablet (10 mg total) by mouth at bedtime.    Dispense:  30 tablet    Refill:  11    Return in about 4 weeks (around 01/10/2020).    Please do your part to reduce the spread of COVID-19.

## 2019-12-13 NOTE — Progress Notes (Signed)
Synopsis: Referred in April 2021 for cough by Enid Skeens., MD.  Subjective:   PATIENT ID: Belinda Mcmillan: female DOB: 1950-09-13, MRN: [id number]  Chief Complaint  Patient presents with  . Follow-up    Patient is doing better since last visit. Did Arlyce Harman in 7/2. Cough is better, still wakes up with congestion and stuffy nose. Zyrtec is helping.     Belinda Mcmillan is a 69 y/o woman who presents for follow-up of recurrent cough.  This began after recent upper respiratory viral illness.  She has had 5 acute visits since I last saw her in mid April.  During that time she was started on Pepcid for GERD, which she is taking once daily.  She has breakthrough symptoms about once every 2 weeks, especially after eating greasy food.  Overall her reflux feels well controlled.  Due to continued coughing she was prescribed Phenergan, Tessalon, which did not improve her symptoms.  Spirometry and FeNO were ordered at follow-up.  Subsequently she was given doxycycline, prednisone, restarted on antihistamine, and continued on nightly Pepcid.  Her symptoms finally improved after prednisone and doxycycline.  She completed her PFTs last week.  She has an albuterol rescue and inhaler at home, which she has not been using.  She currently denies cough or other pulmonary symptoms.  Overall she is feeling well.  She has not yet had her Covid vaccine.  She has had one pneumococcal vaccine.    OV 09/21/19: Belinda Mcmillan is a 69 year old woman who presents for cough that has been going on for about 2 weeks.  This began with cold-like symptoms, fever, painful adenopathy in her neck.  She had a sore throat, and cough which has persisted.  She also has a headache, sinus pressure, nasal congestion, itching in her throat, sneezing, watery eyes.  She has significant ongoing fatigue.  Her cough is dry.  She was previously prescribed prednisone, azithromycin, Tessalon without significant benefit in her symptoms.  She is  feeling somewhat improved, but mostly complains of ongoing fatigue.  She was Covid negative last week.  She is unsure if she has a history of allergies, but has been taking cetirizine-pseudoephedrine plus Mucinex DM.  She was told that she has thrush.  When she has had cold in the past she does not usually have lingering symptoms.  She tends to get colds frequently in the spring and fall.  She denies shortness of breath, wheezing, rhinorrhea, nausea, vomiting, diarrhea.  She has occasional green sputum for the past week and indigestion.  She is having insomnia since starting the cetirizine-pseudoephedrine.  There is no family history of lung disease.  She is a never smoker.  She had a chest x-ray at [facility] which was read as normal.   Past Medical History:  Diagnosis Date  . Allergy   . Diabetes mellitus June 2012   T2DM  . Hyperlipidemia   . Hypertension      Family History  Problem Relation Age of Onset  . Cancer Mother        melanoma  . Cancer Father        lung  . Healthy Sister   . Healthy Daughter   . Cancer Other   . Breast cancer Maternal Aunt 8     Past Surgical History:  Procedure Laterality Date  . CHOLECYSTECTOMY  10/21/10  . DILATION AND CURETTAGE OF UTERUS    . Miscarriage x2      Social History   Socioeconomic History  .  Marital status: Widowed    Spouse name: Not on file  . Number of children: Not on file  . Years of education: Not on file  . Highest education level: Not on file  Occupational History  . Not on file  Tobacco Use  . Smoking status: Passive Smoke Exposure - Never Smoker  . Smokeless tobacco: Never Used  Vaping Use  . Vaping Use: Never used  Substance and Sexual Activity  . Alcohol use: No  . Drug use: No  . Sexual activity: Never  Other Topics Concern  . Not on file  Social History Narrative  . Not on file   Social Determinants of Health   Financial Resource Strain:   . Difficulty of Paying Living Expenses:   Food Insecurity:     . Worried About Charity fundraiser in the Last Year:   . Arboriculturist in the Last Year:   Transportation Needs:   . Film/video editor (Medical):   Marland Kitchen Lack of Transportation (Non-Medical):   Physical Activity:   . Days of Exercise per Week:   . Minutes of Exercise per Session:   Stress:   . Feeling of Stress :   Social Connections:   . Frequency of Communication with Friends and Family:   . Frequency of Social Gatherings with Friends and Family:   . Attends Religious Services:   . Active Member of Clubs or Organizations:   . Attends Archivist Meetings:   Marland Kitchen Marital Status:   Intimate Partner Violence:   . Fear of Current or Ex-Partner:   . Emotionally Abused:   Marland Kitchen Physically Abused:   . Sexually Abused:      Allergies  Allergen Reactions  . Penicillins      Immunization History  Administered Date(s) Administered  . Pneumococcal Conjugate-13 12/13/2017  . Pneumococcal Polysaccharide-23 12/13/2019    Outpatient Medications Prior to Visit  Medication Sig Dispense Refill  . albuterol (VENTOLIN HFA) 108 (90 Base) MCG/ACT inhaler Inhale 2 puffs into the lungs every 4 (four) hours as needed for wheezing or shortness of breath.    Marland Kitchen atorvastatin (LIPITOR) 10 MG tablet Take 20 mg by mouth daily.     . famotidine (PEPCID) 20 MG tablet Take 1 tablet (20 mg total) by mouth daily. 30 tablet 3  . fluticasone (FLONASE) 50 MCG/ACT nasal spray Place 2 sprays into both nostrils daily. 16 g 11  . losartan (COZAAR) 100 MG tablet Take 100 mg by mouth daily.    . pravastatin (PRAVACHOL) 40 MG tablet Take 1 tablet (40 mg total) by mouth daily. 30 tablet 1  . promethazine (PHENERGAN) 6.25 MG/5ML syrup Take 5 mLs (6.25 mg total) by mouth at bedtime as needed (cough at bedtime). (Patient not taking: Reported on 12/13/2019) 120 mL 0  . clotrimazole (MYCELEX) 10 MG troche Take 10 mg by mouth 5 (five) times daily.    . predniSONE (DELTASONE) 10 MG tablet Take 4 tabs po daily x 2 days;  then 3 tabs for 2 days; then 2 tabs for 2 days; then 1 tab for 2 days 20 tablet 0   No facility-administered medications prior to visit.    Review of Systems  Constitutional: Positive for malaise/fatigue. Negative for fever and weight loss.  HENT: Positive for congestion and sore throat.   Respiratory: Positive for cough and sputum production. Negative for shortness of breath and wheezing.   Cardiovascular: Negative for chest pain and leg swelling.  Gastrointestinal: Positive for heartburn.  Negative for nausea and vomiting.  Endo/Heme/Allergies: Positive for environmental allergies.     Objective:   Vitals:   12/13/19 1201  BP: 110/60  Pulse: 78  Temp: 98.6 F (37 C)  TempSrc: Oral  SpO2: 96%  Weight: 139 lb 12.8 oz (63.4 kg)  Height: 5\' 4"  (1.626 m)   96% on   RA BMI Readings from Last 3 Encounters:  12/13/19 24.00 kg/m  11/24/19 22.96 kg/m  10/24/19 23.00 kg/m   Wt Readings from Last 3 Encounters:  12/13/19 139 lb 12.8 oz (63.4 kg)  11/24/19 138 lb (62.6 kg)  10/24/19 138 lb 3.2 oz (62.7 kg)    Physical Exam Vitals reviewed.  Constitutional:      General: She is not in acute distress.    Appearance: Normal appearance. She is not ill-appearing.  HENT:     Head: Normocephalic and atraumatic.  Eyes:     General: No scleral icterus. Cardiovascular:     Rate and Rhythm: Normal rate and regular rhythm.     Heart sounds: No murmur heard.   Pulmonary:     Comments: Breathing comfortably on room air, no conversational dyspnea.  Clear to auscultation bilaterally.  No coughing observed. Abdominal:     General: There is no distension.     Palpations: Abdomen is soft.  Musculoskeletal:        General: No swelling or deformity.     Cervical back: Neck supple.  Lymphadenopathy:     Cervical: No cervical adenopathy.  Skin:    General: Skin is warm and dry.  Neurological:     General: No focal deficit present.     Mental Status: She is alert.     Coordination:  Coordination normal.  Psychiatric:        Mood and Affect: Mood normal.        Behavior: Behavior normal.      CBC No results found for: WBC, RBC, HGB, HCT, PLT, MCV, MCH, MCHC, RDW, LYMPHSABS, MONOABS, EOSABS, BASOSABS  CHEMISTRY No results for input(s): NA, K, CL, CO2, GLUCOSE, BUN, CREATININE, CALCIUM, MG, PHOS in the last 168 hours. CrCl cannot be calculated (No successful lab value found.).  Exhaled NO 15  Chest Imaging- films reviewed: CXR, 2 view 11/24/2019-kyphosis, no opacities  CXR, 2 view 10/24/2019-bilateral airway thickening, no opacities.  Kyphosis.  Pulmonary Functions Testing Results: No flowsheet data found.  Spirometry 12/08/2019: FVC 4.0L (125%)--> 3.2L (101%, -20%) FEV1 2.6 (107%) -->2.8 (116%, +9%) Ratio 65%--> 88% Reversible mild obstruction      Assessment & Plan:     ICD-10-CM   1. Mild persistent asthma without complication  G31.51   2. Allergic rhinitis, unspecified seasonality, unspecified trigger  J30.9    Mild persistent asthma, likely allergic asthma.  Previous recurrent cough resolved. -Continue daily cetirizine and Flonase twice daily -Start Flovent 15mcg twice daily.  Rinse her mouth after every use.  We discussed the importance of maintenance inhaler to control symptoms and prevent exacerbations requiring prednisone and antibiotics.   -Adding montelukast daily -Albuterol every 4 hours as needed for shortness of breath or wheezing. -Up-to-date on Prevnar 13.   -Pneumovax 23 today.   -Encouraged to get her Covid vaccine.  Needs annual flu vaccination.  Allergic rhinosinusitis -Continue cetirizine -Continue Flonase  GERD -Continue nightly Pepcid.  If she begins to have more frequent breakthrough symptoms, she should be on this twice daily.  We discussed that she can use a second dose in the morning on an as-needed basis  if she knows that she is can be eating greasy foods that day.  RTC in 1 month.   Current Outpatient Medications:   .  albuterol (VENTOLIN HFA) 108 (90 Base) MCG/ACT inhaler, Inhale 2 puffs into the lungs every 4 (four) hours as needed for wheezing or shortness of breath., Disp: , Rfl:  .  atorvastatin (LIPITOR) 10 MG tablet, Take 20 mg by mouth daily. , Disp: , Rfl:  .  famotidine (PEPCID) 20 MG tablet, Take 1 tablet (20 mg total) by mouth daily., Disp: 30 tablet, Rfl: 3 .  fluticasone (FLONASE) 50 MCG/ACT nasal spray, Place 2 sprays into both nostrils daily., Disp: 16 g, Rfl: 11 .  losartan (COZAAR) 100 MG tablet, Take 100 mg by mouth daily., Disp: , Rfl:  .  fluticasone (FLOVENT HFA) 110 MCG/ACT inhaler, Inhale 2 puffs into the lungs 2 (two) times daily., Disp: 1 Inhaler, Rfl: 11 .  montelukast (SINGULAIR) 10 MG tablet, Take 1 tablet (10 mg total) by mouth at bedtime., Disp: 30 tablet, Rfl: 11 .  promethazine (PHENERGAN) 6.25 MG/5ML syrup, Take 5 mLs (6.25 mg total) by mouth at bedtime as needed (cough at bedtime). (Patient not taking: Reported on 12/13/2019), Disp: 120 mL, Rfl: 0     Julian Hy, DO [facility] 12/13/2019 12:52 PM

## 2019-12-14 ENCOUNTER — Telehealth: Payer: Self-pay | Admitting: Critical Care Medicine

## 2019-12-14 NOTE — Telephone Encounter (Signed)
Patient contacted with medication alternatives. She is going to call us back after she checks with the pharmacy to see which of the three alternatives are the cheapest. Will wait for her call.

## 2019-12-14 NOTE — Telephone Encounter (Signed)
Patient reporting Flovent, prescribed yesterday, is very expensive. Asking for an alternative. Please advise.

## 2019-12-14 NOTE — Telephone Encounter (Signed)
All of these are acceptable and equivalent options. I would try them in this order to determine what her insurance is most likely to cover. Arnuity 138mcg once daily. Qvar 41mcg BID Asthmanex 162mcg BID  Thanks!  Julian Hy, DO 12/14/19 2:34 PM [facility]

## 2019-12-18 ENCOUNTER — Telehealth: Payer: Self-pay | Admitting: Critical Care Medicine

## 2019-12-18 NOTE — Telephone Encounter (Signed)
Dr. Carlis Abbott, please advise if you are okay with [place] sending Rx to pharmacy for [place] since it is the one that is covered by pt's insurance.

## 2019-12-19 MED ORDER — ARNUITY ELLIPTA 100 MCG/ACT IN AEPB: 1.0000 | INHALATION_SPRAY | Freq: Every day | RESPIRATORY_TRACT | 5 refills | Status: DC

## 2019-12-19 NOTE — Telephone Encounter (Signed)
100

## 2019-12-19 NOTE — Telephone Encounter (Signed)
Rx for arnuity 100 has been sent to pharmacy for pt. Called and spoke with pt letting her know this had been done and she verbalized understanding. Nothing further needed.

## 2019-12-19 NOTE — Telephone Encounter (Signed)
Spoke with pt. She is aware of Dr. Ainsley Spinner response.  Dr. Carlis Abbott - which strength of Arnuity do you want the pt on, 100 or 200?

## 2019-12-19 NOTE — Telephone Encounter (Signed)
Yes thank you Julian Hy, DO 12/19/19 6:55 AM [facility]

## 2019-12-22 ENCOUNTER — Telehealth: Payer: Self-pay | Admitting: Critical Care Medicine

## 2019-12-22 NOTE — Telephone Encounter (Signed)
Dr. Carlis Abbott, please advise if you are okay with pt switching from the [place] to Qvar due to pt not able to afford the Arnuity?

## 2019-12-25 NOTE — Telephone Encounter (Signed)
That is fine- Qvar 40 mcg, 2 puffs BID.  Thanks!  LPC

## 2019-12-26 ENCOUNTER — Other Ambulatory Visit: Payer: Self-pay | Admitting: Critical Care Medicine

## 2019-12-26 MED ORDER — BECLOMETHASONE DIPROPIONATE 80 MCG/ACT IN AERS
2.0000 | INHALATION_SPRAY | Freq: Two times a day (BID) | RESPIRATORY_TRACT | 6 refills | Status: DC
Start: 2019-12-26 — End: 2020-01-22

## 2019-12-26 NOTE — Telephone Encounter (Signed)
Rx for Qvar has been sent to pharmacy for pt. I have discontinued arnuity off of pt's med list. Called and spoke with pt letting her know that the Rx for Qvar was sent to pharmacy for her and she verbalized understanding. Nothing further needed.

## 2020-01-01 ENCOUNTER — Telehealth: Payer: Self-pay | Admitting: Critical Care Medicine

## 2020-01-01 NOTE — Telephone Encounter (Signed)
Called patient let her know pharmacy's recommendations. Patient verbalized understanding. Dr. Carlis Abbott is out this week. I will route to APP of the day to determine change in therapy for patient.    Sarah please advise

## 2020-01-01 NOTE — Telephone Encounter (Signed)
LMTCB x 1 will speak with her before sending in rx

## 2020-01-01 NOTE — Telephone Encounter (Signed)
Please call in Flovent hfa. 110 mcg twice daily. Remind patient that both have copays of $94.57, due to patient having $47.57 of her pharmacy deductible remaining and is applying to all claims. Once met, her monthly copay will be $47.00. If unable to afford she can apply for financial assistance. Thanks so much.

## 2020-01-01 NOTE — Telephone Encounter (Signed)
Qvar is non-formulary.  Plan prefers Arnuity or Flovent, but both have copays of $94.57, due to patient having $47.57 of her pharmacy deductible remaining and is applying to all claims. Once met, her monthly copay will be $47.00.  If unable to afford, patient can apply for patient assistance for the chosen medication.

## 2020-01-01 NOTE — Telephone Encounter (Signed)
Spoke with the pt  She states that the qvar is not affordable for her and neither is the arnuity  She is unsure about any other alternatives  Pharm team can you help with this? Thanks!

## 2020-01-02 ENCOUNTER — Telehealth: Payer: Self-pay | Admitting: Critical Care Medicine

## 2020-01-02 MED ORDER — FLUTICASONE PROPIONATE HFA 110 MCG/ACT IN AERO
2.0000 | INHALATION_SPRAY | Freq: Two times a day (BID) | RESPIRATORY_TRACT | 11 refills | Status: DC
Start: 2020-01-02 — End: 2022-02-25

## 2020-01-02 NOTE — Telephone Encounter (Signed)
See 01/02/20 phone note

## 2020-01-02 NOTE — Telephone Encounter (Signed)
See phone note dated 01/02/20- flovent to be called in  I spoke with the pt and notified of this and she verbalized understanding  The rx was sent to her preferred pharm

## 2020-01-18 ENCOUNTER — Other Ambulatory Visit: Payer: Self-pay | Admitting: Primary Care

## 2020-01-22 ENCOUNTER — Encounter: Payer: Self-pay | Admitting: Critical Care Medicine

## 2020-01-22 ENCOUNTER — Other Ambulatory Visit: Payer: Self-pay

## 2020-01-22 ENCOUNTER — Ambulatory Visit: Payer: Medicare HMO | Admitting: Critical Care Medicine

## 2020-01-22 VITALS — BP 118/68 | HR 80 | Temp 97.7°F | Ht 64.0 in | Wt 140.0 lb

## 2020-01-22 DIAGNOSIS — K219 Gastro-esophageal reflux disease without esophagitis: Secondary | ICD-10-CM

## 2020-01-22 DIAGNOSIS — J4530 Mild persistent asthma, uncomplicated: Secondary | ICD-10-CM

## 2020-01-22 DIAGNOSIS — J309 Allergic rhinitis, unspecified: Secondary | ICD-10-CM

## 2020-01-22 NOTE — Patient Instructions (Addendum)
Thank you for visiting Dr. Carlis Abbott at [facility]. We recommend the following:  Keep all medications the same.    Tilt your head back after using your flonase to help with your ear drainage. Albuterol is only for acute symptoms. It is your rescue medicine.  Flovent should be used two times per day every day.   Return in about 3 months (around 04/23/2020).    Please do your part to reduce the spread of COVID-19.

## 2020-01-22 NOTE — Progress Notes (Signed)
Synopsis: Referred in April 2021 for cough by Enid Skeens., MD.  Subjective:   PATIENT ID: Belinda Mcmillan: female DOB: 13-Jun-1950, MRN: [id number]  Chief Complaint  Patient presents with  . Follow-up    Mrs. Belinda Mcmillan is a 69 year old woman who presents for follow-up of asthma.  To the cost of inhalers she was not able start Qvar, but started Flovent about 2 weeks ago.  She remains on montelukast daily.  Concerning symptoms she is feeling improved.  No wheezing, cough, or shortness of breath with exertion.  She has not been needing her albuterol recently.  She has been having some nasal congestion and postnasal drip in the morning, which is improved.  Some ear fullness, especially recently.   OV 12/13/19: [name] is a 69 y/o woman who presents for follow-up of recurrent cough.  This began after recent upper respiratory viral illness.  She has had 5 acute visits since I last saw her in mid April.  During that time she was started on Pepcid for GERD, which she is taking once daily.  She has breakthrough symptoms about once every 2 weeks, especially after eating greasy food.  Overall her reflux feels well controlled.  Due to continued coughing she was prescribed Phenergan, Tessalon, which did not improve her symptoms.  Spirometry and FeNO were ordered at follow-up.  Subsequently she was given doxycycline, prednisone, restarted on antihistamine, and continued on nightly Pepcid.  Her symptoms finally improved after prednisone and doxycycline.  She completed her PFTs last week.  She has an albuterol rescue and inhaler at home, which she has not been using.  She currently denies cough or other pulmonary symptoms.  Overall she is feeling well.  She has not yet had her Covid vaccine.  She has had one pneumococcal vaccine.   OV 09/21/19: Mrs. Belinda Mcmillan is a 69 year old woman who presents for cough that has been going on for about 2 weeks.  This began with cold-like symptoms, fever, painful  adenopathy in her neck.  She had a sore throat, and cough which has persisted.  She also has a headache, sinus pressure, nasal congestion, itching in her throat, sneezing, watery eyes.  She has significant ongoing fatigue.  Her cough is dry.  She was previously prescribed prednisone, azithromycin, Tessalon without significant benefit in her symptoms.  She is feeling somewhat improved, but mostly complains of ongoing fatigue.  She was Covid negative last week.  She is unsure if she has a history of allergies, but has been taking cetirizine-pseudoephedrine plus Mucinex DM.  She was told that she has thrush.  When she has had cold in the past she does not usually have lingering symptoms.  She tends to get colds frequently in the spring and fall.  She denies shortness of breath, wheezing, rhinorrhea, nausea, vomiting, diarrhea.  She has occasional green sputum for the past week and indigestion.  She is having insomnia since starting the cetirizine-pseudoephedrine.  There is no family history of lung disease.  She is a never smoker.  She had a chest x-ray at [facility] which was read as normal.  Lab Results  Component Value Date   NITRICOXIDE 15 12/08/2019    Past Medical History:  Diagnosis Date  . Allergy   . Diabetes mellitus June 2012   T2DM  . Hyperlipidemia   . Hypertension      Family History  Problem Relation Age of Onset  . Cancer Mother        melanoma  .  Cancer Father        lung  . Healthy Sister   . Healthy Daughter   . Cancer Other   . Breast cancer Maternal Aunt 50     Past Surgical History:  Procedure Laterality Date  . CHOLECYSTECTOMY  10/21/10  . DILATION AND CURETTAGE OF UTERUS    . Miscarriage x2      Social History   Socioeconomic History  . Marital status: Widowed    Spouse name: Not on file  . Number of children: Not on file  . Years of education: Not on file  . Highest education level: Not on file  Occupational History  . Not on file  Tobacco Use  . Smoking  status: Passive Smoke Exposure - Never Smoker  . Smokeless tobacco: Never Used  Vaping Use  . Vaping Use: Never used  Substance and Sexual Activity  . Alcohol use: No  . Drug use: No  . Sexual activity: Never  Other Topics Concern  . Not on file  Social History Narrative  . Not on file   Social Determinants of Health   Financial Resource Strain:   . Difficulty of Paying Living Expenses:   Food Insecurity:   . Worried About Charity fundraiser in the Last Year:   . Arboriculturist in the Last Year:   Transportation Needs:   . Film/video editor (Medical):   Marland Kitchen Lack of Transportation (Non-Medical):   Physical Activity:   . Days of Exercise per Week:   . Minutes of Exercise per Session:   Stress:   . Feeling of Stress :   Social Connections:   . Frequency of Communication with Friends and Family:   . Frequency of Social Gatherings with Friends and Family:   . Attends Religious Services:   . Active Member of Clubs or Organizations:   . Attends Archivist Meetings:   Marland Kitchen Marital Status:   Intimate Partner Violence:   . Fear of Current or Ex-Partner:   . Emotionally Abused:   Marland Kitchen Physically Abused:   . Sexually Abused:      Allergies  Allergen Reactions  . Penicillins      Immunization History  Administered Date(s) Administered  . Pneumococcal Conjugate-13 12/13/2017  . Pneumococcal Polysaccharide-23 12/13/2019    Outpatient Medications Prior to Visit  Medication Sig Dispense Refill  . albuterol (VENTOLIN HFA) 108 (90 Base) MCG/ACT inhaler Inhale 2 puffs into the lungs every 4 (four) hours as needed for wheezing or shortness of breath.    Marland Kitchen atorvastatin (LIPITOR) 10 MG tablet Take 20 mg by mouth daily.     . famotidine (PEPCID) 20 MG tablet TAKE 1 TABLET BY MOUTH EVERY DAY 90 tablet 1  . fluticasone (FLONASE) 50 MCG/ACT nasal spray Place 2 sprays into both nostrils daily. 16 g 11  . fluticasone (FLOVENT HFA) 110 MCG/ACT inhaler Inhale 2 puffs into the  lungs 2 (two) times daily. 1 Inhaler 11  . losartan (COZAAR) 100 MG tablet Take 100 mg by mouth daily.    . montelukast (SINGULAIR) 10 MG tablet Take 1 tablet (10 mg total) by mouth at bedtime. 30 tablet 11  . beclomethasone (QVAR) 80 MCG/ACT inhaler Inhale 2 puffs into the lungs 2 (two) times daily. 1 Inhaler 6  . promethazine (PHENERGAN) 6.25 MG/5ML syrup Take 5 mLs (6.25 mg total) by mouth at bedtime as needed (cough at bedtime). (Patient not taking: Reported on 01/22/2020) 120 mL 0   No facility-administered medications  prior to visit.    ROS   Objective:   Vitals:   01/22/20 1058  BP: 118/68  Pulse: 80  Temp: 97.7 F (36.5 C)  SpO2: 98%  Weight: 140 lb (63.5 kg)  Height: 5\' 4"  (1.626 m)   98% on  RA BMI Readings from Last 3 Encounters:  01/22/20 24.03 kg/m  12/13/19 24.00 kg/m  11/24/19 22.96 kg/m   Wt Readings from Last 3 Encounters:  01/22/20 140 lb (63.5 kg)  12/13/19 139 lb 12.8 oz (63.4 kg)  11/24/19 138 lb (62.6 kg)    Physical Exam Vitals reviewed.  Constitutional:      General: She is not in acute distress.    Appearance: She is not ill-appearing.  HENT:     Head: Normocephalic and atraumatic.     Ears:     Comments: Mild effusion in left middle ear, no erythema    Nose:     Comments: Deferred due to masking requirement.    Mouth/Throat:     Comments: Deferred due to masking requirement. Eyes:     General: No scleral icterus. Cardiovascular:     Rate and Rhythm: Normal rate and regular rhythm.     Heart sounds: No murmur heard.   Pulmonary:     Comments: Breathing comfortably on room air, no conversational dyspnea.  Clear to auscultation bilaterally. Abdominal:     General: There is no distension.     Palpations: Abdomen is soft.     Tenderness: There is no abdominal tenderness.  Musculoskeletal:        General: No swelling or deformity.     Cervical back: Neck supple.  Lymphadenopathy:     Cervical: No cervical adenopathy.  Skin:     General: Skin is warm and dry.     Findings: No rash.  Neurological:     General: No focal deficit present.     Mental Status: She is alert.     Coordination: Coordination normal.  Psychiatric:        Mood and Affect: Mood normal.        Behavior: Behavior normal.      CBC No results found for: WBC, RBC, HGB, HCT, PLT, MCV, MCH, MCHC, RDW, LYMPHSABS, MONOABS, EOSABS, BASOSABS  CHEMISTRY No results for input(s): NA, K, CL, CO2, GLUCOSE, BUN, CREATININE, CALCIUM, MG, PHOS in the last 168 hours. CrCl cannot be calculated (No successful lab value found.).   Chest Imaging- films reviewed: CXR, 2 view 11/24/2019-kyphosis, no opacities  CXR, 2 view 10/24/2019-bilateral airway thickening, no opacities.  Kyphosis.  Pulmonary Functions Testing Results: No flowsheet data found.  Spirometry 12/08/2019: FVC 4.0L (125%)--> 3.2L (101%, -20%) FEV1 2.6 (107%) -->2.8 (116%, +9%) Ratio 65%--> 88% Reversible mild obstruction     Assessment & Plan:     ICD-10-CM   1. Mild persistent asthma without complication  P54.65   2. Allergic rhinitis, unspecified seasonality, unspecified trigger  J30.9   3. Gastroesophageal reflux disease, unspecified whether esophagitis present  K21.9      Mild persistent asthma, likely allergic asthma.  Previous recurrent cough resolved. -Continue montelukast once daily -Continue Flovent 140mcg twice daily.    Rinse after every use. -Albuterol every 4 hours as needed for shortness of breath or wheezing. -Up-to-date on pneumonia vaccines.  Needs annual flu vaccine when available to her. Strongly recommend Covid vaccination.  Allergic rhinosinusitis -Continue montelukast and Flonase once daily  GERD -Continue nightly Pepcid.  If she begins to have more frequent breakthrough symptoms, she  should be on this twice daily.  We discussed that she can use a second dose in the morning on an as-needed basis if she knows that she is can be eating greasy foods that  day.  RTC in 3 months.   Current Outpatient Medications:  .  albuterol (VENTOLIN HFA) 108 (90 Base) MCG/ACT inhaler, Inhale 2 puffs into the lungs every 4 (four) hours as needed for wheezing or shortness of breath., Disp: , Rfl:  .  atorvastatin (LIPITOR) 10 MG tablet, Take 20 mg by mouth daily. , Disp: , Rfl:  .  famotidine (PEPCID) 20 MG tablet, TAKE 1 TABLET BY MOUTH EVERY DAY, Disp: 90 tablet, Rfl: 1 .  fluticasone (FLONASE) 50 MCG/ACT nasal spray, Place 2 sprays into both nostrils daily., Disp: 16 g, Rfl: 11 .  fluticasone (FLOVENT HFA) 110 MCG/ACT inhaler, Inhale 2 puffs into the lungs 2 (two) times daily., Disp: 1 Inhaler, Rfl: 11 .  losartan (COZAAR) 100 MG tablet, Take 100 mg by mouth daily., Disp: , Rfl:  .  montelukast (SINGULAIR) 10 MG tablet, Take 1 tablet (10 mg total) by mouth at bedtime., Disp: 30 tablet, Rfl: 11 .  promethazine (PHENERGAN) 6.25 MG/5ML syrup, Take 5 mLs (6.25 mg total) by mouth at bedtime as needed (cough at bedtime). (Patient not taking: Reported on 01/22/2020), Disp: 120 mL, Rfl: 0     Julian Hy, DO [facility] 01/22/2020 11:20 AM

## 2020-02-13 ENCOUNTER — Telehealth: Payer: Self-pay | Admitting: Critical Care Medicine

## 2020-02-13 NOTE — Telephone Encounter (Signed)
Called and spoke with patient she states that she has a UTI and  Kidney stone and seems like when she takes her montelukast it is making her get up and down a lot during the night to go to the bathroom. She is not sure if its from the medication or everything else going on. Is wondering if there is something else she can take or if she can take it during the day and see if it makes a difference.  Dr. Carlis Abbott please advise

## 2020-02-14 NOTE — Telephone Encounter (Signed)
Probably unrelated symptoms, but she can try over the counter zyrtec/ cetirizine  or claritin/ loratidine once daily instead. She can also take montelukast in the morning.  Julian Hy, DO 02/14/20 7:05 AM [facility]

## 2020-02-14 NOTE — Telephone Encounter (Signed)
ATC patient.  LMTCB. 

## 2020-02-15 NOTE — Telephone Encounter (Signed)
Called and spoke with patient to let her know about Dr. Idelle Leech. Patient states that she will try and take Montelukast in the morning and see how she does. If she does not improve she will switch to Cetirizine and call and let us know. Nothing further needed at this time.

## 2020-02-15 NOTE — Telephone Encounter (Signed)
Tried calling pt and there was no answer- LMTCB.  

## 2020-02-15 NOTE — Telephone Encounter (Signed)
Pt returning a phone call. Pt can be reached back at [phone].

## 2020-04-22 ENCOUNTER — Ambulatory Visit: Payer: Medicare HMO | Admitting: Critical Care Medicine

## 2020-04-24 ENCOUNTER — Encounter: Payer: Self-pay | Admitting: Critical Care Medicine

## 2020-04-24 ENCOUNTER — Other Ambulatory Visit: Payer: Self-pay

## 2020-04-24 ENCOUNTER — Ambulatory Visit: Payer: Medicare HMO | Admitting: Critical Care Medicine

## 2020-04-24 VITALS — BP 122/68 | HR 65 | Temp 97.3°F | Ht 65.0 in | Wt 139.4 lb

## 2020-04-24 DIAGNOSIS — K219 Gastro-esophageal reflux disease without esophagitis: Secondary | ICD-10-CM

## 2020-04-24 DIAGNOSIS — J4530 Mild persistent asthma, uncomplicated: Secondary | ICD-10-CM | POA: Diagnosis not present

## 2020-04-24 DIAGNOSIS — J309 Allergic rhinitis, unspecified: Secondary | ICD-10-CM | POA: Diagnosis not present

## 2020-04-24 NOTE — Progress Notes (Signed)
Synopsis: Referred in April 2021 for cough by Enid Skeens., MD.  Subjective:   PATIENT ID: Belinda Mcmillan: female DOB: May 02, 1951, MRN: [id number]  Chief Complaint  Patient presents with  . Follow-up    Has come head congestion, dry cough    Belinda Mcmillan is a 69 y/o woman with a history of mild persistent asthma who presents for follow up of mild persistent asthma.  She continues using Flovent, but is only taking in the morning.  She stopped taking montelukast due to feeling that after having UTI she was having increased nocturia.  Since stopping it she feels that this is improved.  She started on cetirizine when she stopped montelukast, which she has continued taking.  She has not been taking Flonase intermittently, not recently.  She has had increased sinus congestion and sinus headaches which were improved with Sudafed.  She has not been needing her rescue inhaler.  No nocturnal asthma symptoms.  Only occasional dry cough coming from her throat.  No wheezing.  ACT 25.  She has not yet had her seasonal flu or Covid shots; she has no current plans to receive them.    OV 01/22/20: Belinda Mcmillan is a 69 year old woman who presents for follow-up of asthma.  To the cost of inhalers she was not able start Qvar, but started Flovent about 2 weeks ago.  She remains on montelukast daily.  Concerning symptoms she is feeling improved.  No wheezing, cough, or shortness of breath with exertion.  She has not been needing her albuterol recently.  She has been having some nasal congestion and postnasal drip in the morning, which is improved.  Some ear fullness, especially recently.   OV 12/13/19: Belinda Mcmillan is a 69 y/o woman who presents for follow-up of recurrent cough.  This began after recent upper respiratory viral illness.  She has had 5 acute visits since I last saw her in mid April.  During that time she was started on Pepcid for GERD, which she is taking once daily.  She has  breakthrough symptoms about once every 2 weeks, especially after eating greasy food.  Overall her reflux feels well controlled.  Due to continued coughing she was prescribed Phenergan, Tessalon, which did not improve her symptoms.  Spirometry and FeNO were ordered at follow-up.  Subsequently she was given doxycycline, prednisone, restarted on antihistamine, and continued on nightly Pepcid.  Her symptoms finally improved after prednisone and doxycycline.  She completed her PFTs last week.  She has an albuterol rescue and inhaler at home, which she has not been using.  She currently denies cough or other pulmonary symptoms.  Overall she is feeling well.  She has not yet had her Covid vaccine.  She has had one pneumococcal vaccine.   OV 09/21/19: Belinda Mcmillan is a 69 year old woman who presents for cough that has been going on for about 2 weeks.  This began with cold-like symptoms, fever, painful adenopathy in her neck.  She had a sore throat, and cough which has persisted.  She also has a headache, sinus pressure, nasal congestion, itching in her throat, sneezing, watery eyes.  She has significant ongoing fatigue.  Her cough is dry.  She was previously prescribed prednisone, azithromycin, Tessalon without significant benefit in her symptoms.  She is feeling somewhat improved, but mostly complains of ongoing fatigue.  She was Covid negative last week.  She is unsure if she has a history of allergies, but has been taking cetirizine-pseudoephedrine plus Mucinex  DM.  She was told that she has thrush.  When she has had cold in the past she does not usually have lingering symptoms.  She tends to get colds frequently in the spring and fall.  She denies shortness of breath, wheezing, rhinorrhea, nausea, vomiting, diarrhea.  She has occasional green sputum for the past week and indigestion.  She is having insomnia since starting the cetirizine-pseudoephedrine.  There is no family history of lung disease.  She is a never  smoker.  She had a chest x-ray at [facility] which was read as normal.  Lab Results  Component Value Date   NITRICOXIDE 15 12/08/2019    Past Medical History:  Diagnosis Date  . Allergy   . Diabetes mellitus June 2012   T2DM  . Hyperlipidemia   . Hypertension      Family History  Problem Relation Age of Onset  . Cancer Mother        melanoma  . Cancer Father        lung  . Healthy Sister   . Healthy Daughter   . Cancer Other   . Breast cancer Maternal Aunt 66     Past Surgical History:  Procedure Laterality Date  . CHOLECYSTECTOMY  10/21/10  . DILATION AND CURETTAGE OF UTERUS    . Miscarriage x2      Social History   Socioeconomic History  . Marital status: Widowed    Spouse name: Not on file  . Number of children: Not on file  . Years of education: Not on file  . Highest education level: Not on file  Occupational History  . Not on file  Tobacco Use  . Smoking status: Passive Smoke Exposure - Never Smoker  . Smokeless tobacco: Never Used  Vaping Use  . Vaping Use: Never used  Substance and Sexual Activity  . Alcohol use: No  . Drug use: No  . Sexual activity: Never  Other Topics Concern  . Not on file  Social History Narrative  . Not on file   Social Determinants of Health   Financial Resource Strain:   . Difficulty of Paying Living Expenses: Not on file  Food Insecurity:   . Worried About Charity fundraiser in the Last Year: Not on file  . Ran Out of Food in the Last Year: Not on file  Transportation Needs:   . Lack of Transportation (Medical): Not on file  . Lack of Transportation (Non-Medical): Not on file  Physical Activity:   . Days of Exercise per Week: Not on file  . Minutes of Exercise per Session: Not on file  Stress:   . Feeling of Stress : Not on file  Social Connections:   . Frequency of Communication with Friends and Family: Not on file  . Frequency of Social Gatherings with Friends and Family: Not on file  . Attends Religious  Services: Not on file  . Active Member of Clubs or Organizations: Not on file  . Attends Archivist Meetings: Not on file  . Marital Status: Not on file  Intimate Partner Violence:   . Fear of Current or Ex-Partner: Not on file  . Emotionally Abused: Not on file  . Physically Abused: Not on file  . Sexually Abused: Not on file     Allergies  Allergen Reactions  . Penicillins      Immunization History  Administered Date(s) Administered  . Pneumococcal Conjugate-13 12/13/2017  . Pneumococcal Polysaccharide-23 12/13/2019    Outpatient Medications  Prior to Visit  Medication Sig Dispense Refill  . albuterol (VENTOLIN HFA) 108 (90 Base) MCG/ACT inhaler Inhale 2 puffs into the lungs every 4 (four) hours as needed for wheezing or shortness of breath.    Marland Kitchen atorvastatin (LIPITOR) 10 MG tablet Take 20 mg by mouth daily.     . cetirizine-pseudoephedrine (ZYRTEC-D) 5-120 MG tablet Take 1 tablet by mouth daily.    . famotidine (PEPCID) 20 MG tablet TAKE 1 TABLET BY MOUTH EVERY DAY 90 tablet 1  . fluticasone (FLONASE) 50 MCG/ACT nasal spray Place 2 sprays into both nostrils daily. 16 g 11  . fluticasone (FLOVENT HFA) 110 MCG/ACT inhaler Inhale 2 puffs into the lungs 2 (two) times daily. 1 Inhaler 11  . losartan (COZAAR) 100 MG tablet Take 100 mg by mouth daily.    . promethazine (PHENERGAN) 6.25 MG/5ML syrup Take 5 mLs (6.25 mg total) by mouth at bedtime as needed (cough at bedtime). 120 mL 0  . montelukast (SINGULAIR) 10 MG tablet Take 1 tablet (10 mg total) by mouth at bedtime. (Patient not taking: Reported on 04/24/2020) 30 tablet 11   No facility-administered medications prior to visit.    ROS   Objective:   Vitals:   04/24/20 1020  BP: 122/68  Pulse: 65  Temp: (!) 97.3 F (36.3 C)  TempSrc: Temporal  SpO2: 97%  Weight: 139 lb 6.4 oz (63.2 kg)  Height: 5\' 5"  (1.651 m)   97% on  RA BMI Readings from Last 3 Encounters:  04/24/20 23.20 kg/m  01/22/20 24.03 kg/m    12/13/19 24.00 kg/m   Wt Readings from Last 3 Encounters:  04/24/20 139 lb 6.4 oz (63.2 kg)  01/22/20 140 lb (63.5 kg)  12/13/19 139 lb 12.8 oz (63.4 kg)    Physical Exam Vitals reviewed.  Constitutional:      General: She is not in acute distress.    Appearance: She is not ill-appearing.  HENT:     Head: Normocephalic and atraumatic.  Eyes:     General: No scleral icterus. Cardiovascular:     Rate and Rhythm: Normal rate and regular rhythm.     Heart sounds: No murmur heard.   Pulmonary:     Comments: CTAB, breathing comfortably on RA Abdominal:     General: There is no distension.     Palpations: Abdomen is soft.     Tenderness: There is no abdominal tenderness.  Musculoskeletal:        General: No deformity.     Cervical back: Neck supple.  Lymphadenopathy:     Cervical: No cervical adenopathy.  Skin:    General: Skin is warm and dry.     Findings: No rash.  Neurological:     General: No focal deficit present.     Mental Status: She is alert.     Coordination: Coordination normal.  Psychiatric:        Mood and Affect: Mood normal.        Behavior: Behavior normal.      CBC No results found for: WBC, RBC, HGB, HCT, PLT, MCV, MCH, MCHC, RDW, LYMPHSABS, MONOABS, EOSABS, BASOSABS  CHEMISTRY No results for input(s): NA, K, CL, CO2, GLUCOSE, BUN, CREATININE, CALCIUM, MG, PHOS in the last 168 hours. CrCl cannot be calculated (No successful lab value found.).   Chest Imaging- films reviewed: CXR, 2 view 11/24/2019-kyphosis, no opacities  CXR, 2 view 10/24/2019-bilateral airway thickening, no opacities.  Kyphosis.  Pulmonary Functions Testing Results: No flowsheet data found.  Spirometry  12/08/2019: FVC 4.0L (125%)--> 3.2L (101%, -20%) FEV1 2.6 (107%) -->2.8 (116%, +9%) Ratio 65%--> 88% Reversible mild obstruction     Assessment & Plan:     ICD-10-CM   1. Mild persistent asthma without complication  Z61.09   2. Allergic rhinitis, unspecified  seasonality, unspecified trigger  J30.9   3. Gastroesophageal reflux disease, unspecified whether esophagitis present  K21.9      Mild persistent asthma, likely allergic asthma. Stable- ACT 25. -Discontiued montelukast due to nocturia symptoms.  -Continue Flovent 149mcg-- recommend increasing to the prescribed twice daily use. Rinse after every use.  With uncontrolled allergies right now would recommend against totally de-escalating Flovent to PRN use, but may be able to at follow-up if her allergies are fully controlled.   -Con't cetirizine -Con't albuterol every 4 hours as needed for shortness of breath or wheezing. -Up-to-date on pneumonia vaccines.  Recommend covid vaccines and flu vaccines; we discussed that she has an increased risk of poor outcomes with a history of chronic asthma.  Allergic rhinosinusitis -Restart Flonase once daily; use every day  Not discussed today: GERD -Continue nightly Pepcid.  If she begins to have more frequent breakthrough symptoms, she should be on this twice daily.  We discussed that she can use a second dose in the morning on an as-needed basis if she knows that she is can be eating greasy foods that day.  RTC in 3 months with [place].   Current Outpatient Medications:  .  albuterol (VENTOLIN HFA) 108 (90 Base) MCG/ACT inhaler, Inhale 2 puffs into the lungs every 4 (four) hours as needed for wheezing or shortness of breath., Disp: , Rfl:  .  atorvastatin (LIPITOR) 10 MG tablet, Take 20 mg by mouth daily. , Disp: , Rfl:  .  cetirizine-pseudoephedrine (ZYRTEC-D) 5-120 MG tablet, Take 1 tablet by mouth daily., Disp: , Rfl:  .  famotidine (PEPCID) 20 MG tablet, TAKE 1 TABLET BY MOUTH EVERY DAY, Disp: 90 tablet, Rfl: 1 .  fluticasone (FLONASE) 50 MCG/ACT nasal spray, Place 2 sprays into both nostrils daily., Disp: 16 g, Rfl: 11 .  fluticasone (FLOVENT HFA) 110 MCG/ACT inhaler, Inhale 2 puffs into the lungs 2 (two) times daily., Disp: 1 Inhaler, Rfl: 11 .   losartan (COZAAR) 100 MG tablet, Take 100 mg by mouth daily., Disp: , Rfl:  .  promethazine (PHENERGAN) 6.25 MG/5ML syrup, Take 5 mLs (6.25 mg total) by mouth at bedtime as needed (cough at bedtime)., Disp: 120 mL, Rfl: 0 .  montelukast (SINGULAIR) 10 MG tablet, Take 1 tablet (10 mg total) by mouth at bedtime. (Patient not taking: Reported on 04/24/2020), Disp: 30 tablet, Rfl: [place] Quadry Kampa, DO [facility] 04/24/2020 10:44 AM

## 2020-04-24 NOTE — Patient Instructions (Addendum)
Thank you for visiting Dr. Carlis Abbott at [facility]. We recommend the following:  Restart flonase every day.   I recommend getting your seasonal flu shot and covid vaccines.    Return in about 3 months (around 07/25/2020). with Dr. Erin Fulling (30 minutes visit).     Please do your part to reduce the spread of COVID-19.

## 2020-06-18 ENCOUNTER — Ambulatory Visit: Payer: Self-pay | Admitting: Orthopedic Surgery

## 2020-07-17 ENCOUNTER — Ambulatory Visit: Payer: Medicare HMO | Admitting: Pulmonary Disease

## 2020-07-17 ENCOUNTER — Other Ambulatory Visit: Payer: Self-pay

## 2020-07-17 ENCOUNTER — Encounter: Payer: Self-pay | Admitting: Pulmonary Disease

## 2020-07-17 VITALS — BP 136/78 | HR 88 | Temp 97.2°F | Ht 64.0 in | Wt 137.0 lb

## 2020-07-17 DIAGNOSIS — J4530 Mild persistent asthma, uncomplicated: Secondary | ICD-10-CM | POA: Diagnosis not present

## 2020-07-17 NOTE — Progress Notes (Signed)
Synopsis: Referred in April 2021 for cough by Enid Skeens., MD.  Subjective:   PATIENT ID: Belinda Mcmillan: female DOB: 03-Mar-1951, MRN: [id number]   HPI  Chief Complaint  Patient presents with  . Follow-up    Continues to have runny nose and intermittent cough   Belinda Mcmillan 70 year old woman, never smoker with history of mild persistent asthma who presents for follow up of asthma.  She is using flovent inhaler 2 puffs each morning. She is using flonase daily. She has been taking zyrtec D intermittently. She continues to take singulair. She continues to experience runny nose, nasal congestion, post-nasal drainage and throat irritation with cough. She also complains of left > right ear fullness.  She was last seen by Dr. Redmond Baseman in ENT in 06/2019.   She reports her seasonal allergies are worse in the Spring and Fall.   OV 04/24/20: Belinda Mcmillan is a 70 y/o woman with a history of mild persistent asthma who presents for follow up of mild persistent asthma.  She continues using Flovent, but is only taking in the morning.  She stopped taking montelukast due to feeling that after having UTI she was having increased nocturia.  Since stopping it she feels that this is improved.  She started on cetirizine when she stopped montelukast, which she has continued taking.  She has not been taking Flonase intermittently, not recently.  She has had increased sinus congestion and sinus headaches which were improved with Sudafed.  She has not been needing her rescue inhaler.  No nocturnal asthma symptoms.  Only occasional dry cough coming from her throat.  No wheezing.  ACT 25.  She has not yet had her seasonal flu or Covid shots; she has no current plans to receive them.    OV 01/22/20: Belinda Mcmillan is a 70 year old woman who presents for follow-up of asthma.  To the cost of inhalers she was not able start Qvar, but started Flovent about 2 weeks ago.  She remains on montelukast daily.   Concerning symptoms she is feeling improved.  No wheezing, cough, or shortness of breath with exertion.  She has not been needing her albuterol recently.  She has been having some nasal congestion and postnasal drip in the morning, which is improved.  Some ear fullness, especially recently. Past Medical History:  Diagnosis Date  . Allergy   . Diabetes mellitus June 2012   T2DM  . Hyperlipidemia   . Hypertension      Family History  Problem Relation Age of Onset  . Cancer Mother        melanoma  . Cancer Father        lung  . Healthy Sister   . Healthy Daughter   . Cancer Other   . Breast cancer Maternal Aunt 14     Social History   Socioeconomic History  . Marital status: Widowed    Spouse name: Not on file  . Number of children: Not on file  . Years of education: Not on file  . Highest education level: Not on file  Occupational History  . Not on file  Tobacco Use  . Smoking status: Passive Smoke Exposure - Never Smoker  . Smokeless tobacco: Never Used  Vaping Use  . Vaping Use: Never used  Substance and Sexual Activity  . Alcohol use: No  . Drug use: No  . Sexual activity: Never  Other Topics Concern  . Not on file  Social History Narrative  .  Not on file   Social Determinants of Health   Financial Resource Strain: Not on file  Food Insecurity: Not on file  Transportation Needs: Not on file  Physical Activity: Not on file  Stress: Not on file  Social Connections: Not on file  Intimate Partner Violence: Not on file     Allergies  Allergen Reactions  . Penicillins      Outpatient Medications Prior to Visit  Medication Sig Dispense Refill  . albuterol (VENTOLIN HFA) 108 (90 Base) MCG/ACT inhaler Inhale 2 puffs into the lungs every 4 (four) hours as needed for wheezing or shortness of breath.    Marland Kitchen atorvastatin (LIPITOR) 10 MG tablet Take 20 mg by mouth daily.     . cetirizine-pseudoephedrine (ZYRTEC-D) 5-120 MG tablet Take 1 tablet by mouth daily.    .  famotidine (PEPCID) 20 MG tablet TAKE 1 TABLET BY MOUTH EVERY DAY 90 tablet 1  . fluticasone (FLONASE) 50 MCG/ACT nasal spray Place 2 sprays into both nostrils daily. 16 g 11  . fluticasone (FLOVENT HFA) 110 MCG/ACT inhaler Inhale 2 puffs into the lungs 2 (two) times daily. 1 Inhaler 11  . losartan (COZAAR) 100 MG tablet Take 100 mg by mouth daily.    . promethazine (PHENERGAN) 6.25 MG/5ML syrup Take 5 mLs (6.25 mg total) by mouth at bedtime as needed (cough at bedtime). 120 mL 0  . montelukast (SINGULAIR) 10 MG tablet Take 1 tablet (10 mg total) by mouth at bedtime. 30 tablet 11   No facility-administered medications prior to visit.    Review of Systems  Constitutional: Negative for chills, fever, malaise/fatigue and weight loss.  HENT: Positive for congestion and ear pain (pressure). Negative for sinus pain and sore throat.   Eyes: Negative.   Respiratory: Positive for cough. Negative for hemoptysis, sputum production, shortness of breath and wheezing.   Cardiovascular: Negative for chest pain, palpitations, orthopnea, claudication and leg swelling.  Gastrointestinal: Negative for abdominal pain, heartburn, nausea and vomiting.  Genitourinary: Negative.   Musculoskeletal: Negative for joint pain and myalgias.  Skin: Negative for rash.  Neurological: Negative for weakness.  Endo/Heme/Allergies: Negative.   Psychiatric/Behavioral: Negative.    Objective:   Vitals:   07/17/20 1325  BP: 136/78  Pulse: 88  Temp: (!) 97.2 F (36.2 C)  SpO2: 98%  Weight: 137 lb (62.1 kg)  Height: 5\' 4"  (1.626 m)   Physical Exam Constitutional:      General: She is not in acute distress.    Appearance: She is not ill-appearing.  HENT:     Head: Normocephalic and atraumatic.  Eyes:     General: No scleral icterus.    Conjunctiva/sclera: Conjunctivae normal.     Pupils: Pupils are equal, round, and reactive to light.  Cardiovascular:     Rate and Rhythm: Normal rate and regular rhythm.      Pulses: Normal pulses.     Heart sounds: Normal heart sounds. No murmur heard.   Pulmonary:     Effort: Pulmonary effort is normal.     Breath sounds: Normal breath sounds. No wheezing, rhonchi or rales.  Abdominal:     General: Bowel sounds are normal.     Palpations: Abdomen is soft.  Musculoskeletal:     Right lower leg: No edema.     Left lower leg: No edema.  Lymphadenopathy:     Cervical: No cervical adenopathy.  Skin:    General: Skin is warm and dry.  Neurological:     General: No  focal deficit present.     Mental Status: She is alert.  Psychiatric:        Mood and Affect: Mood normal.        Behavior: Behavior normal.        Thought Content: Thought content normal.        Judgment: Judgment normal.    CBC No results found for: WBC, RBC, HGB, HCT, PLT, MCV, MCH, MCHC, RDW, LYMPHSABS, MONOABS, EOSABS, BASOSABS   Chest imaging: Chest Imaging- films reviewed: CXR, 2 view 11/24/2019-kyphosis, no opacities  CXR, 2 view 10/24/2019-bilateral airway thickening, no opacities. Kyphosis.  PFT: No flowsheet data found.  Spirometry 12/08/2019: FVC4.0L(125%)-->3.2L(101%, -20%) FEV1 2.6 (107%)-->2.8 (116%, +9%) Ratio 65%-->88% Reversible mild obstruction   Assessment & Plan:   Mild persistent asthma without complication  Discussion: Belinda Mcmillan 70 year old woman, never smoker with history of mild persistent asthma who presents for follow up of asthma.  Her asthma appears to be under good control at this time. We discussed that she can use her flovent inhaler between 1-4 puffs per day based on her symptoms. At this time she may not require daily use. During the spring and fall she may require using the inhaler 2 puffs twice daily.   She is to continue taking zyrtec and singulair. She is to continue taking flonase.   She can trial mucinex to see if this helps with her sinus congestion and ear fullness. I did recommend that she reach out to Dr. Redmond Baseman' office,  ENT, for a follow up visit.   She is to follow up in 6 months.   Freda Jackson, MD [facility] Office: [phone]   See Amion for Pager Details    Current Outpatient Medications:  .  albuterol (VENTOLIN HFA) 108 (90 Base) MCG/ACT inhaler, Inhale 2 puffs into the lungs every 4 (four) hours as needed for wheezing or shortness of breath., Disp: , Rfl:  .  atorvastatin (LIPITOR) 10 MG tablet, Take 20 mg by mouth daily. , Disp: , Rfl:  .  cetirizine-pseudoephedrine (ZYRTEC-D) 5-120 MG tablet, Take 1 tablet by mouth daily., Disp: , Rfl:  .  famotidine (PEPCID) 20 MG tablet, TAKE 1 TABLET BY MOUTH EVERY DAY, Disp: 90 tablet, Rfl: 1 .  fluticasone (FLONASE) 50 MCG/ACT nasal spray, Place 2 sprays into both nostrils daily., Disp: 16 g, Rfl: 11 .  fluticasone (FLOVENT HFA) 110 MCG/ACT inhaler, Inhale 2 puffs into the lungs 2 (two) times daily., Disp: 1 Inhaler, Rfl: 11 .  losartan (COZAAR) 100 MG tablet, Take 100 mg by mouth daily., Disp: , Rfl:  .  promethazine (PHENERGAN) 6.25 MG/5ML syrup, Take 5 mLs (6.25 mg total) by mouth at bedtime as needed (cough at bedtime)., Disp: 120 mL, Rfl: 0

## 2020-07-17 NOTE — Patient Instructions (Addendum)
Can try mucinex or mucinx DM for the sinus pressure and cough.   Continue on flonase daily  Take zyrtec or zyrtec D on a daily basis for allergy (or generic equivalent)   Continue flovent 137mcg up to 4 puffs per day  Recommend calling Dr. Redmond Baseman, ENT doctor, to schedule a follow up

## 2020-09-03 ENCOUNTER — Other Ambulatory Visit: Payer: Self-pay | Admitting: Family Medicine

## 2020-09-03 DIAGNOSIS — Z1231 Encounter for screening mammogram for malignant neoplasm of breast: Secondary | ICD-10-CM

## 2020-09-25 ENCOUNTER — Ambulatory Visit
Admission: RE | Admit: 2020-09-25 | Discharge: 2020-09-25 | Disposition: A | Discharge status: Home / Self Care | Payer: Medicare HMO | Source: Ambulatory Visit | Attending: Family Medicine | Admitting: Family Medicine

## 2020-09-25 ENCOUNTER — Other Ambulatory Visit: Payer: Self-pay

## 2020-09-25 DIAGNOSIS — Z1231 Encounter for screening mammogram for malignant neoplasm of breast: Secondary | ICD-10-CM

## 2020-12-24 ENCOUNTER — Ambulatory Visit: Payer: BLUE CROSS/BLUE SHIELD | Admitting: Podiatry

## 2020-12-31 ENCOUNTER — Other Ambulatory Visit: Payer: Self-pay | Admitting: Critical Care Medicine

## 2021-01-27 ENCOUNTER — Encounter: Payer: Self-pay | Admitting: Pulmonary Disease

## 2021-01-27 ENCOUNTER — Ambulatory Visit: Payer: Medicare HMO | Admitting: Pulmonary Disease

## 2021-01-27 ENCOUNTER — Other Ambulatory Visit: Payer: Self-pay

## 2021-01-27 VITALS — BP 118/70 | HR 91 | Temp 98.8°F | Ht 65.0 in | Wt 138.8 lb

## 2021-01-27 DIAGNOSIS — J302 Other seasonal allergic rhinitis: Secondary | ICD-10-CM

## 2021-01-27 DIAGNOSIS — J4520 Mild intermittent asthma, uncomplicated: Secondary | ICD-10-CM

## 2021-01-27 NOTE — Progress Notes (Signed)
Synopsis: Referred in April 2021 for cough by Enid Skeens., MD.  Subjective:   PATIENT ID: Belinda Mcmillan: female DOB: 1951-02-03, MRN: [id number]  HPI  Chief Complaint  Patient presents with   Follow-up    No complaint's    Belinda Mcmillan is a 70 year old woman, never smoker with history of mild intermittent asthma who returns for follow up.  She is using flovent inhaler as needed and has been primarily using it at night prior to bedtime. She has not been experiencing any nighttime symptoms to prompt it's use. She is not requiring albuterol as needed.   She continues to have ear fullness and did not contact Dr. Redmond Baseman after last follow up for further evaluation/management.   She has seasonal allergies worse in the spring and fall. She is using saline nasal spray daily. Using fluticasone nasal spray as needed.   Past Medical History:  Diagnosis Date   Allergy    Diabetes mellitus June 2012   T2DM   Hyperlipidemia    Hypertension      Family History  Problem Relation Age of Onset   Cancer Mother        melanoma   Cancer Father        lung   Healthy Sister    Healthy Daughter    Cancer Other    Breast cancer Maternal Aunt 28     Social History   Socioeconomic History   Marital status: Widowed    Spouse name: Not on file   Number of children: Not on file   Years of education: Not on file   Highest education level: Not on file  Occupational History   Not on file  Tobacco Use   Smoking status: Never    Passive exposure: Yes   Smokeless tobacco: Never  Vaping Use   Vaping Use: Never used  Substance and Sexual Activity   Alcohol use: No   Drug use: No   Sexual activity: Never  Other Topics Concern   Not on file  Social History Narrative   Not on file   Social Determinants of Health   Financial Resource Strain: Not on file  Food Insecurity: Not on file  Transportation Needs: Not on file  Physical Activity: Not on file  Stress: Not on  file  Social Connections: Not on file  Intimate Partner Violence: Not on file     Allergies  Allergen Reactions   Penicillins      Outpatient Medications Prior to Visit  Medication Sig Dispense Refill   albuterol (VENTOLIN HFA) 108 (90 Base) MCG/ACT inhaler Inhale 2 puffs into the lungs every 4 (four) hours as needed for wheezing or shortness of breath.     atorvastatin (LIPITOR) 10 MG tablet Take 20 mg by mouth daily.      cetirizine-pseudoephedrine (ZYRTEC-D) 5-120 MG tablet Take 1 tablet by mouth daily.     losartan (COZAAR) 100 MG tablet Take 100 mg by mouth daily.     montelukast (SINGULAIR) 10 MG tablet TAKE 1 TABLET BY MOUTH EVERYDAY AT BEDTIME 90 tablet 3   promethazine (PHENERGAN) 6.25 MG/5ML syrup Take 5 mLs (6.25 mg total) by mouth at bedtime as needed (cough at bedtime). 120 mL 0   fluticasone (FLONASE) 50 MCG/ACT nasal spray Place 2 sprays into both nostrils daily. (Patient not taking: Reported on 01/27/2021) 16 g 11   fluticasone (FLOVENT HFA) 110 MCG/ACT inhaler Inhale 2 puffs into the lungs 2 (two) times daily. 1  Inhaler 11   famotidine (PEPCID) 20 MG tablet TAKE 1 TABLET BY MOUTH EVERY DAY 90 tablet 1   No facility-administered medications prior to visit.    Review of Systems  Constitutional:  Negative for chills, fever, malaise/fatigue and weight loss.  HENT:  Positive for congestion and ear pain (pressure). Negative for sinus pain and sore throat.   Eyes: Negative.   Respiratory:  Negative for hemoptysis, sputum production, shortness of breath and wheezing.   Cardiovascular:  Negative for chest pain, palpitations, orthopnea, claudication and leg swelling.  Gastrointestinal:  Negative for abdominal pain, heartburn, nausea and vomiting.  Genitourinary: Negative.   Musculoskeletal:  Negative for joint pain and myalgias.  Skin:  Negative for rash.  Neurological:  Negative for weakness.  Endo/Heme/Allergies: Negative.   Psychiatric/Behavioral: Negative.     Objective:   Vitals:   01/27/21 1401  BP: 118/70  Pulse: 91  Temp: 98.8 F (37.1 C)  TempSrc: Oral  SpO2: 99%  Weight: 138 lb 12.8 oz (63 kg)  Height: '5\' 5"'$  (1.651 m)   Physical Exam Constitutional:      General: She is not in acute distress.    Appearance: She is not ill-appearing.  HENT:     Head: Normocephalic and atraumatic.  Eyes:     General: No scleral icterus.    Conjunctiva/sclera: Conjunctivae normal.     Pupils: Pupils are equal, round, and reactive to light.  Cardiovascular:     Rate and Rhythm: Normal rate and regular rhythm.     Pulses: Normal pulses.     Heart sounds: Normal heart sounds. No murmur heard. Pulmonary:     Effort: Pulmonary effort is normal.     Breath sounds: Normal breath sounds. No wheezing, rhonchi or rales.  Abdominal:     General: Bowel sounds are normal.     Palpations: Abdomen is soft.  Musculoskeletal:     Right lower leg: No edema.     Left lower leg: No edema.  Skin:    General: Skin is warm and dry.  Neurological:     General: No focal deficit present.     Mental Status: She is alert.   CBC No results found for: WBC, RBC, HGB, HCT, PLT, MCV, MCH, MCHC, RDW, LYMPHSABS, MONOABS, EOSABS, BASOSABS   Chest imaging: Chest Imaging- films reviewed: CXR, 2 view 11/24/2019-kyphosis, no opacities   CXR, 2 view 10/24/2019-bilateral airway thickening, no opacities.  Kyphosis.   PFT: No flowsheet data found.  Spirometry 12/08/2019: FVC 4.0L (125%)--> 3.2L (101%, -20%) FEV1 2.6 (107%) -->2.8 (116%, +9%) Ratio 65%--> 88% Reversible mild obstruction   Assessment & Plan:   Mild intermittent asthma without complication  Seasonal allergic rhinitis, unspecified trigger  Discussion: Belinda Mcmillan is a 70 year old woman, never smoker with history of mild intermittent asthma who returns for follow up.  Her asthma continues to be well controlled at this time with as needed flovent use.  During the spring and fall she may require  using the inhaler 2 puffs twice daily.   She is to continue taking zyrtec and singulair. She is to continue taking flonase.   I did recommend that she reach out to Dr. Redmond Baseman' office, ENT, for a follow up visit for her ear fullness.   She is to follow up in 12 months.   Freda Jackson, MD [facility] Office: [phone]   See Amion for Pager Details    Current Outpatient Medications:    albuterol (VENTOLIN HFA) 108 (  90 Base) MCG/ACT inhaler, Inhale 2 puffs into the lungs every 4 (four) hours as needed for wheezing or shortness of breath., Disp: , Rfl:    atorvastatin (LIPITOR) 10 MG tablet, Take 20 mg by mouth daily. , Disp: , Rfl:    cetirizine-pseudoephedrine (ZYRTEC-D) 5-120 MG tablet, Take 1 tablet by mouth daily., Disp: , Rfl:    losartan (COZAAR) 100 MG tablet, Take 100 mg by mouth daily., Disp: , Rfl:    montelukast (SINGULAIR) 10 MG tablet, TAKE 1 TABLET BY MOUTH EVERYDAY AT BEDTIME, Disp: 90 tablet, Rfl: 3   fluticasone (FLONASE) 50 MCG/ACT nasal spray, Place 2 sprays into both nostrils daily. (Patient not taking: Reported on 01/27/2021), Disp: 16 g, Rfl: 11   fluticasone (FLOVENT HFA) 110 MCG/ACT inhaler, Inhale 2 puffs into the lungs 2 (two) times daily., Disp: 1 Inhaler, Rfl: 11

## 2021-01-27 NOTE — Patient Instructions (Signed)
Use flovent inhaler 1-2 puffs every 12 hours as needed  Use albuterol inhaler 1-2 puffs every 4-6 hours as needed  Use fluticasone nasal spray 2 sprays per nostril daily during troublesome allergy months  Use montelukast '10mg'$  daily during allergy months

## 2021-02-01 ENCOUNTER — Encounter: Payer: Self-pay | Admitting: Pulmonary Disease

## 2021-05-29 IMAGING — MG DIGITAL SCREENING BILAT W/ TOMO W/ CAD
8 series · 8 of 24 positions shown · non-contrast
Comparison: Previous exam(s).

CLINICAL DATA: Screening.

EXAM:
DIGITAL SCREENING BILATERAL MAMMOGRAM WITH TOMO AND CAD

[L CC synth-2D]
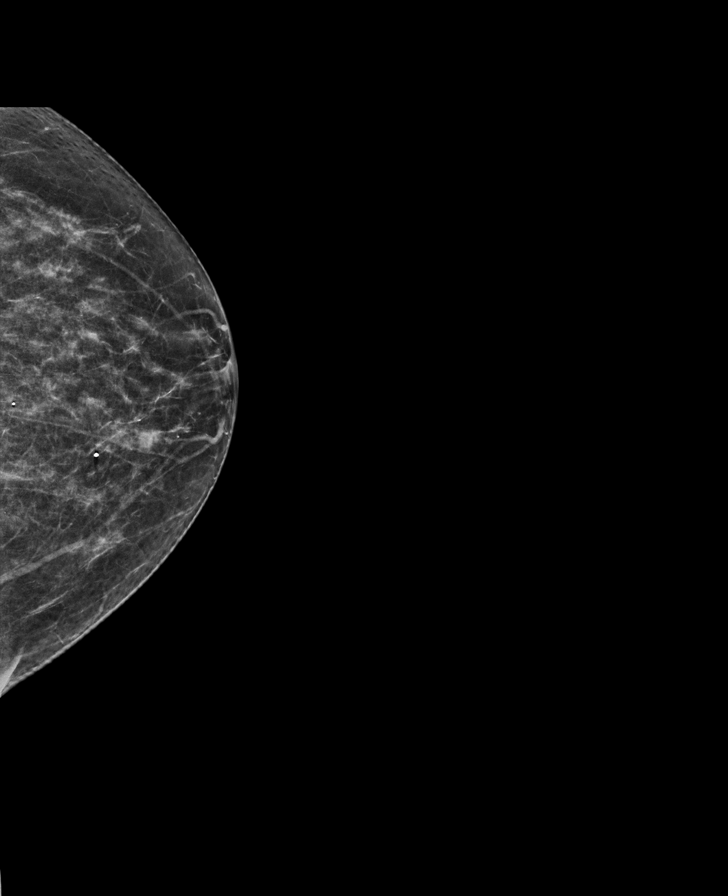

[R CC synth-2D]
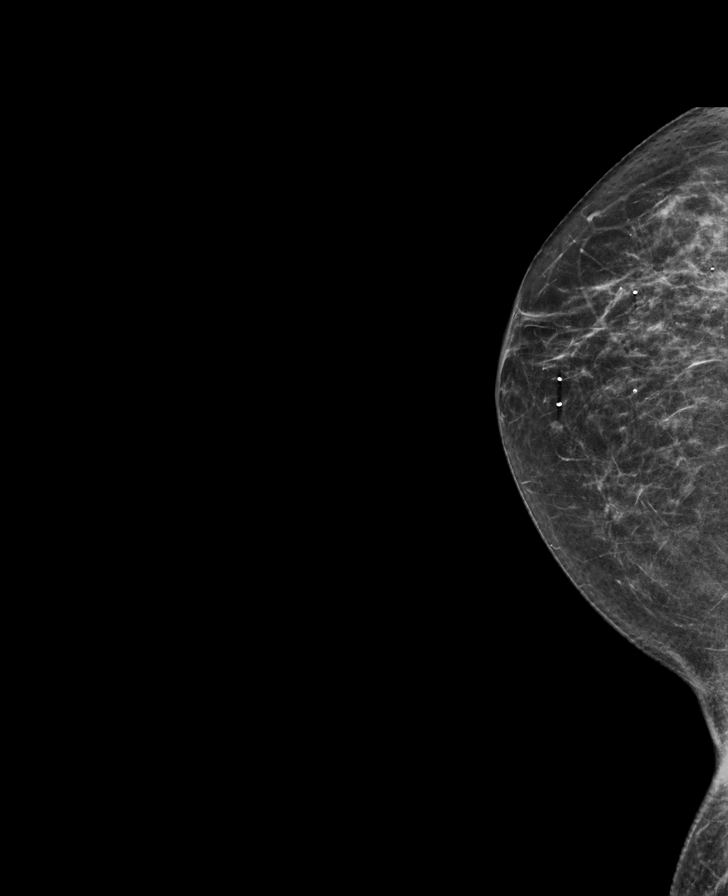

[L MLO synth-2D]
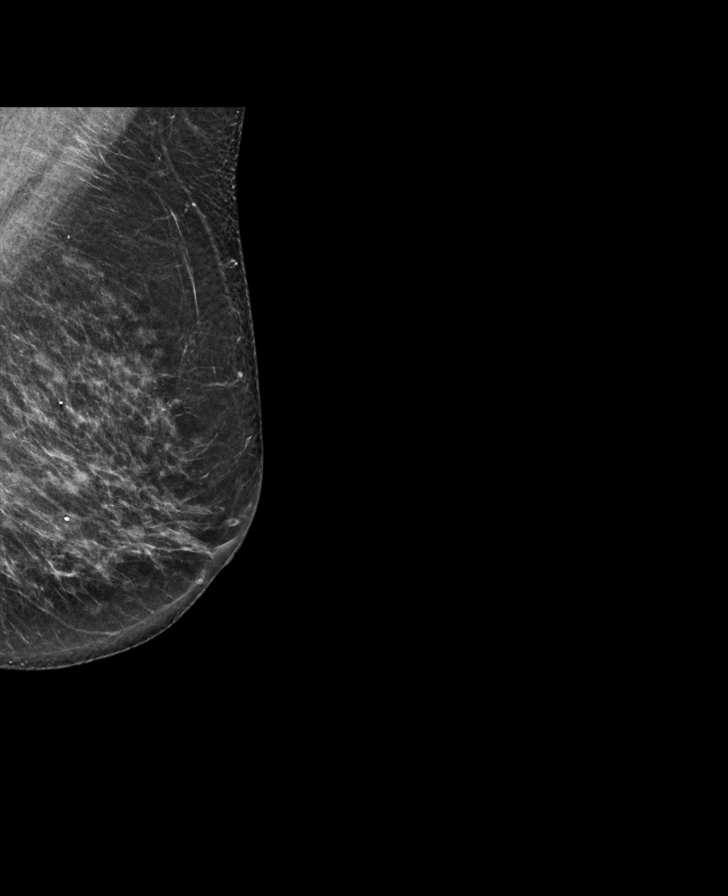

[R MLO synth-2D]
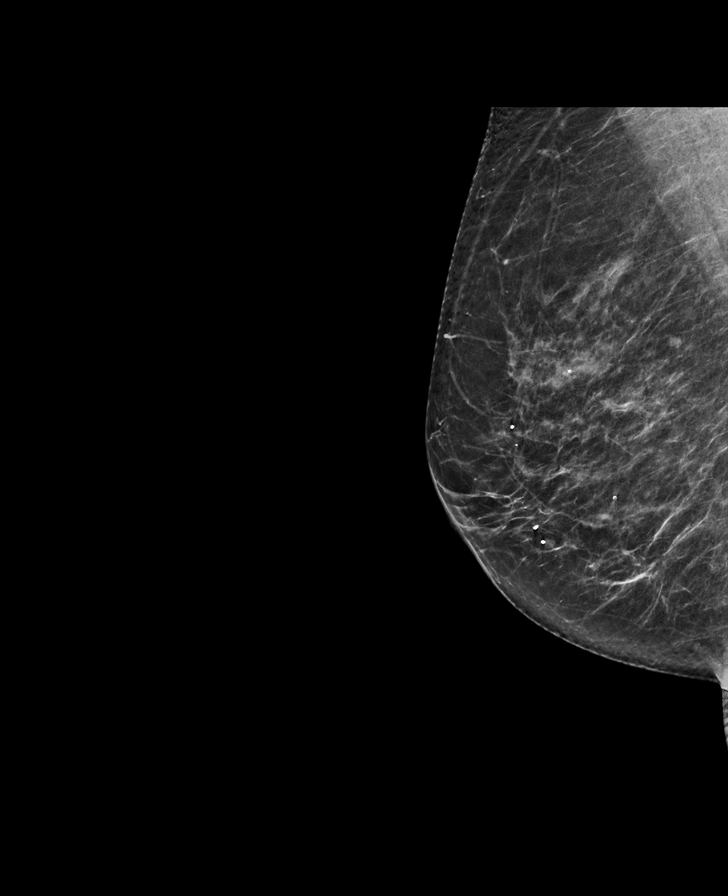

[R MLO tomo · tomo slice 33/65.0]
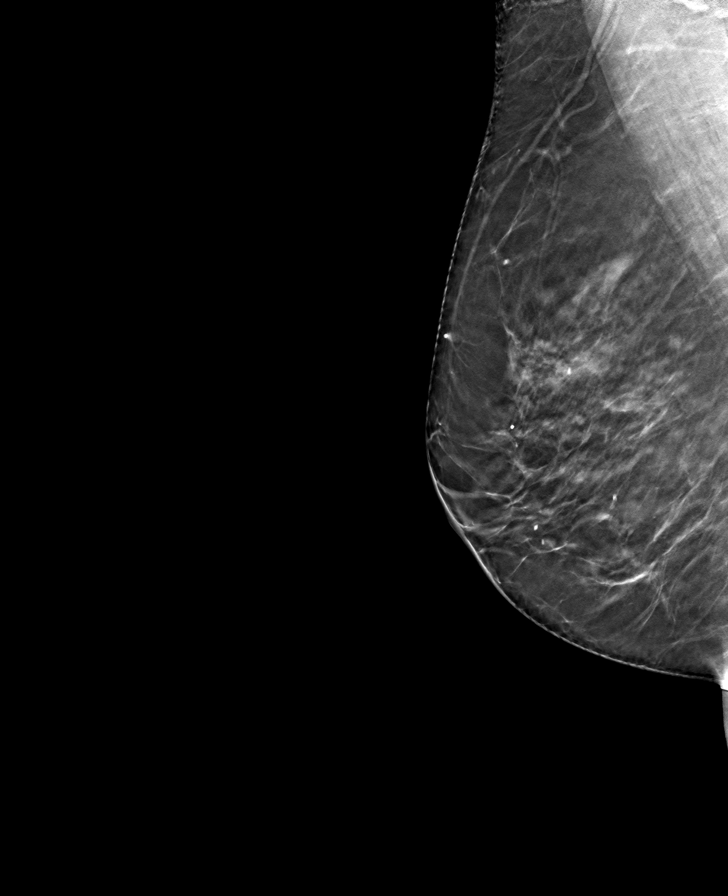

[L MLO tomo · tomo slice 32/63.0]
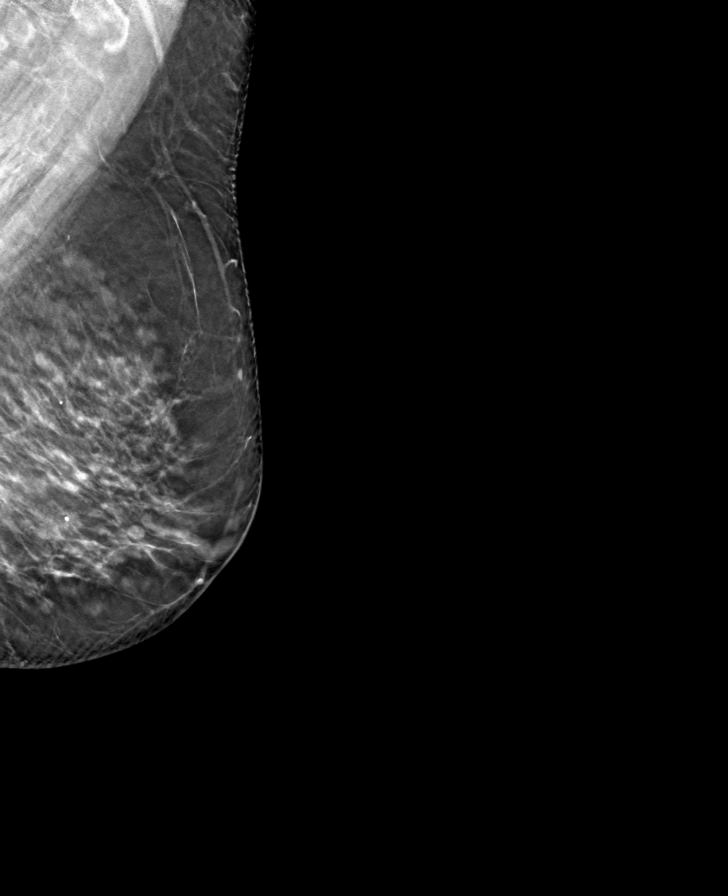

[R CC tomo · tomo slice 32/63.0]
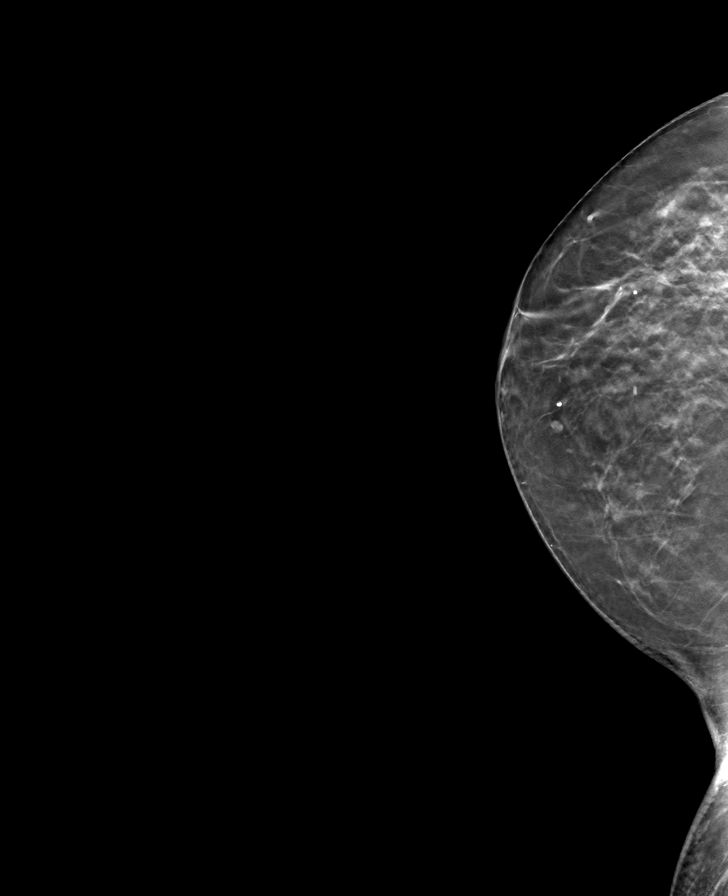

[L CC tomo · tomo slice 29/56.0]
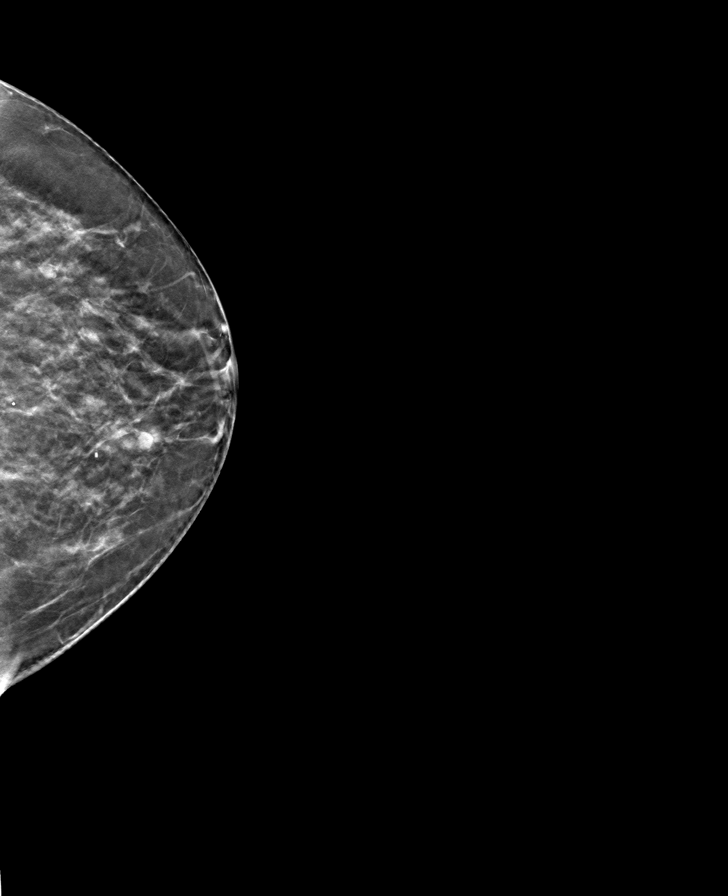

[8 of 24 positions shown; findings below may reference images not displayed]

ACR Breast Density Category b: There are scattered areas of
fibroglandular density.
FINDINGS: In the left breast, a possible mass warrants further evaluation. In
the right breast, no findings suspicious for malignancy.

Images were processed with CAD.
IMPRESSION: Further evaluation is suggested for possible mass in the left
breast.

RECOMMENDATION:
Diagnostic mammogram and possibly ultrasound of the left breast.
(Code:AS-5-44A)

BI-RADS CATEGORY  0: Incomplete. Need additional imaging evaluation
and/or prior mammograms for comparison.

## 2021-06-19 DIAGNOSIS — I700 Atherosclerosis of aorta: Secondary | ICD-10-CM | POA: Diagnosis not present

## 2021-06-19 DIAGNOSIS — E785 Hyperlipidemia, unspecified: Secondary | ICD-10-CM | POA: Diagnosis not present

## 2021-06-19 DIAGNOSIS — M546 Pain in thoracic spine: Secondary | ICD-10-CM | POA: Diagnosis not present

## 2021-06-19 DIAGNOSIS — M85852 Other specified disorders of bone density and structure, left thigh: Secondary | ICD-10-CM | POA: Diagnosis not present

## 2021-06-19 DIAGNOSIS — E1159 Type 2 diabetes mellitus with other circulatory complications: Secondary | ICD-10-CM | POA: Diagnosis not present

## 2021-06-19 DIAGNOSIS — I10 Essential (primary) hypertension: Secondary | ICD-10-CM | POA: Diagnosis not present

## 2021-06-25 DIAGNOSIS — U071 COVID-19: Secondary | ICD-10-CM | POA: Diagnosis not present

## 2021-06-27 IMAGING — DX DG CHEST 2V
2 series · 2 of 2 positions shown · non-contrast
Comparison: 1030

CLINICAL DATA: Cough

EXAM:
CHEST - 2 VIEW

[chest pa]
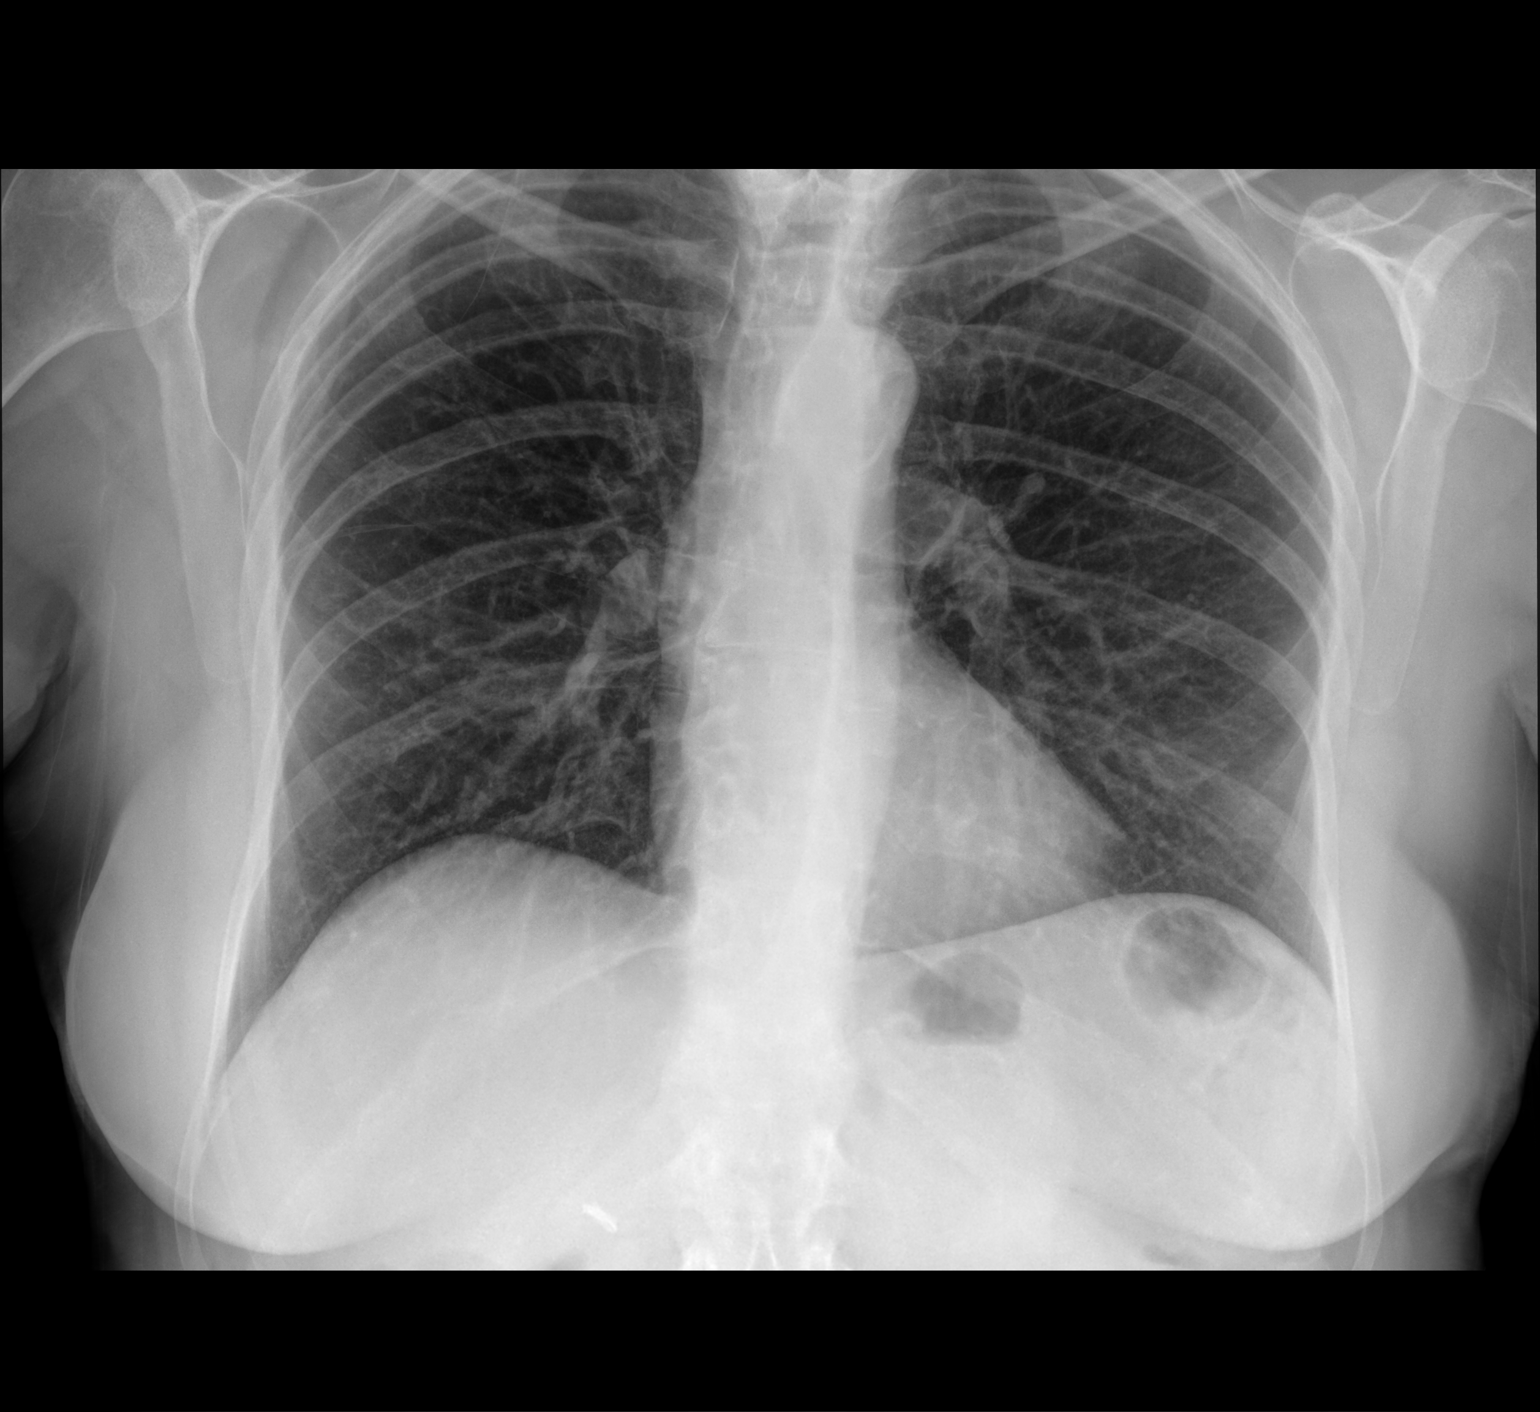

[chest lat]
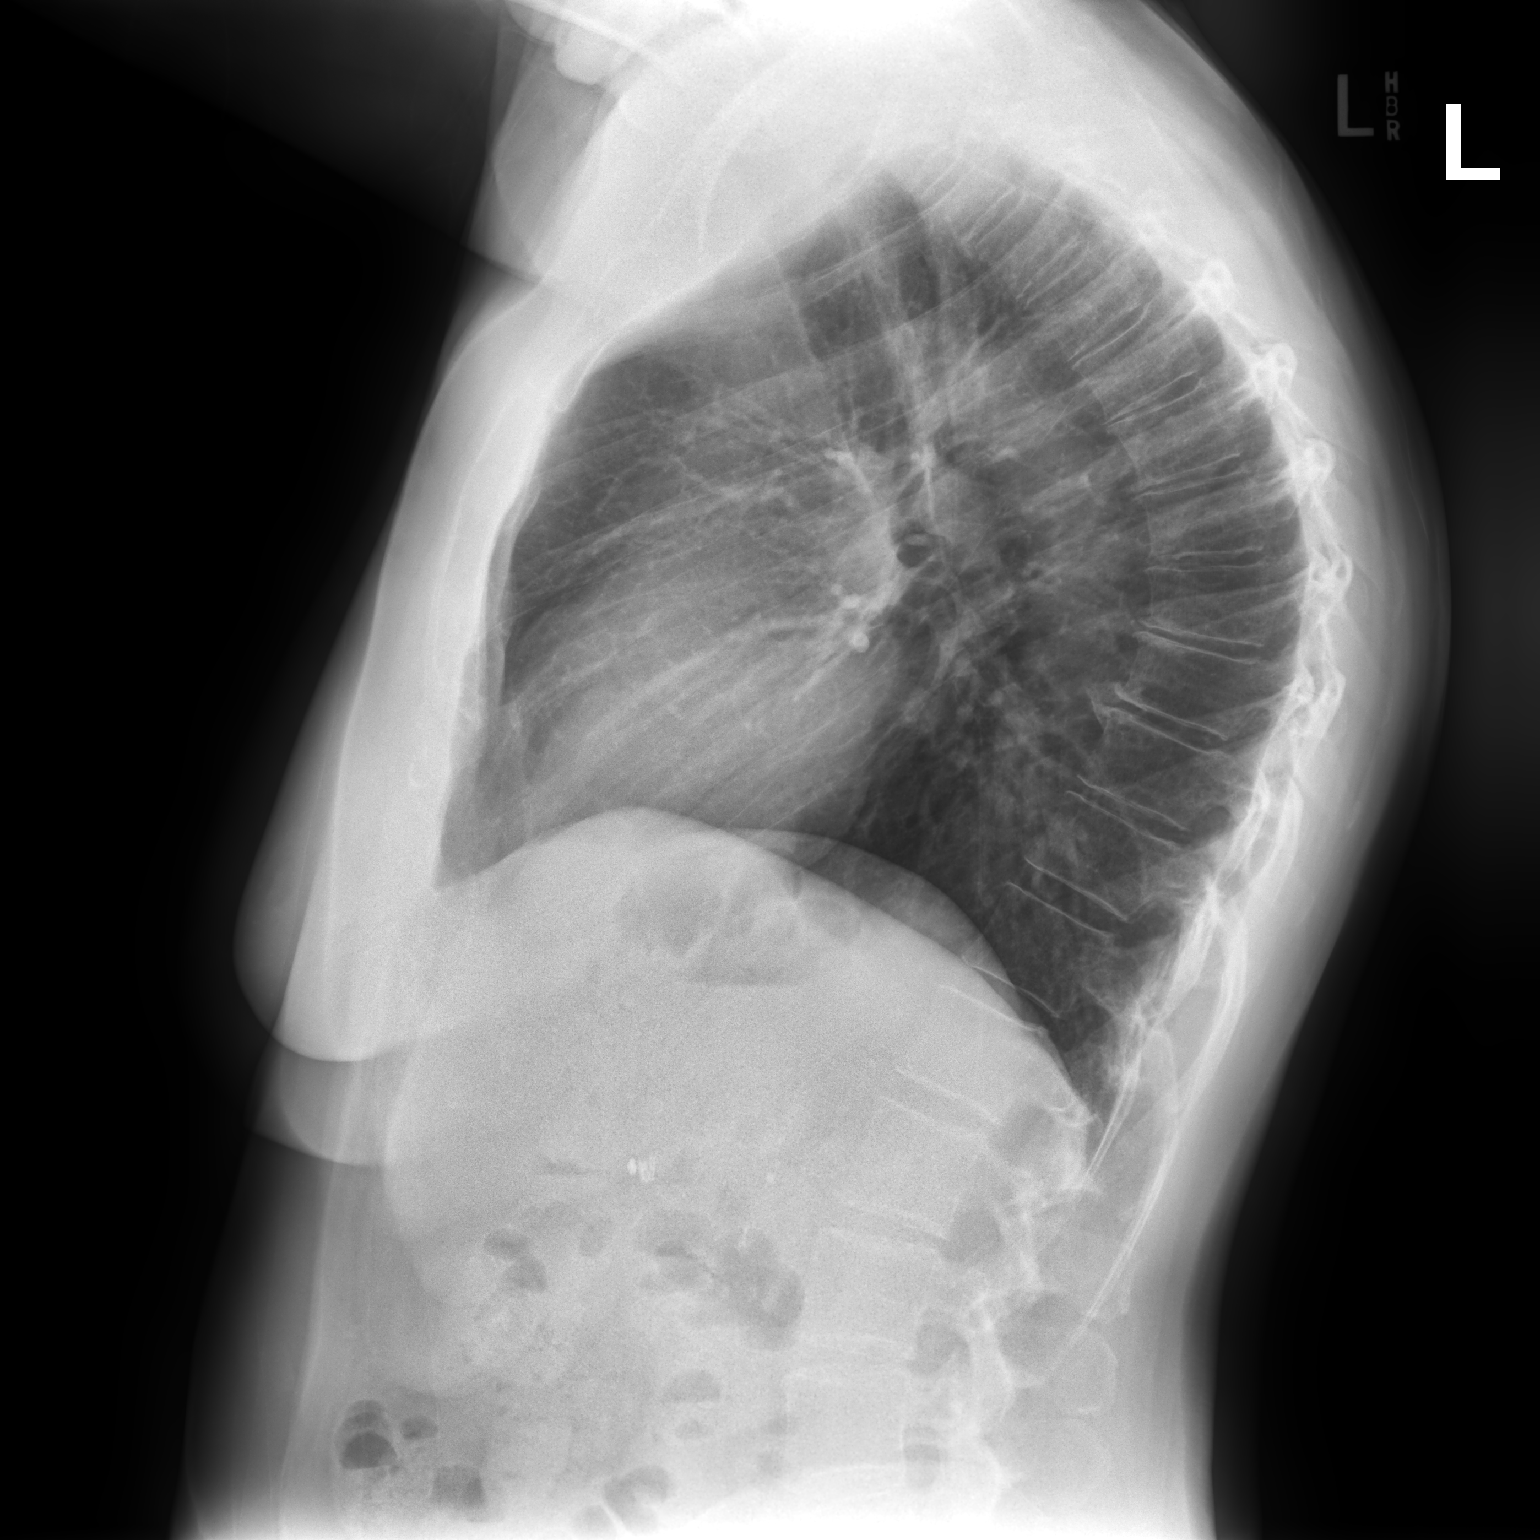

[2 of 2 positions shown; findings below may reference images not displayed]

FINDINGS: The heart size and mediastinal contours are within normal limits. No
consolidation or edema. No pleural effusion. No acute osseous
abnormality.
IMPRESSION: No acute process in the chest.

## 2021-07-01 ENCOUNTER — Other Ambulatory Visit: Payer: Self-pay | Admitting: Internal Medicine

## 2021-07-01 DIAGNOSIS — M85852 Other specified disorders of bone density and structure, left thigh: Secondary | ICD-10-CM

## 2021-07-09 DIAGNOSIS — F419 Anxiety disorder, unspecified: Secondary | ICD-10-CM | POA: Diagnosis not present

## 2021-07-09 DIAGNOSIS — R0981 Nasal congestion: Secondary | ICD-10-CM | POA: Diagnosis not present

## 2021-07-09 DIAGNOSIS — K219 Gastro-esophageal reflux disease without esophagitis: Secondary | ICD-10-CM | POA: Diagnosis not present

## 2021-07-09 DIAGNOSIS — R002 Palpitations: Secondary | ICD-10-CM | POA: Diagnosis not present

## 2021-07-09 DIAGNOSIS — I10 Essential (primary) hypertension: Secondary | ICD-10-CM | POA: Diagnosis not present

## 2021-07-10 ENCOUNTER — Other Ambulatory Visit: Payer: Self-pay | Admitting: Critical Care Medicine

## 2021-07-15 DIAGNOSIS — N952 Postmenopausal atrophic vaginitis: Secondary | ICD-10-CM | POA: Diagnosis not present

## 2021-07-29 ENCOUNTER — Other Ambulatory Visit: Payer: Self-pay | Admitting: Internal Medicine

## 2021-07-29 DIAGNOSIS — Z1231 Encounter for screening mammogram for malignant neoplasm of breast: Secondary | ICD-10-CM

## 2021-09-16 DIAGNOSIS — R350 Frequency of micturition: Secondary | ICD-10-CM | POA: Diagnosis not present

## 2021-09-16 DIAGNOSIS — N390 Urinary tract infection, site not specified: Secondary | ICD-10-CM | POA: Diagnosis not present

## 2021-09-29 ENCOUNTER — Ambulatory Visit
Admission: RE | Admit: 2021-09-29 | Discharge: 2021-09-29 | Disposition: A | Discharge status: Home / Self Care | Payer: PPO | Source: Ambulatory Visit | Attending: Internal Medicine | Admitting: Internal Medicine

## 2021-09-29 DIAGNOSIS — Z1231 Encounter for screening mammogram for malignant neoplasm of breast: Secondary | ICD-10-CM

## 2021-10-14 DIAGNOSIS — R82998 Other abnormal findings in urine: Secondary | ICD-10-CM | POA: Diagnosis not present

## 2021-10-14 DIAGNOSIS — E1159 Type 2 diabetes mellitus with other circulatory complications: Secondary | ICD-10-CM | POA: Diagnosis not present

## 2021-10-14 DIAGNOSIS — E785 Hyperlipidemia, unspecified: Secondary | ICD-10-CM | POA: Diagnosis not present

## 2021-10-14 DIAGNOSIS — I10 Essential (primary) hypertension: Secondary | ICD-10-CM | POA: Diagnosis not present

## 2021-10-14 DIAGNOSIS — I700 Atherosclerosis of aorta: Secondary | ICD-10-CM | POA: Diagnosis not present

## 2021-11-10 DIAGNOSIS — Z01419 Encounter for gynecological examination (general) (routine) without abnormal findings: Secondary | ICD-10-CM | POA: Diagnosis not present

## 2021-11-10 DIAGNOSIS — Z1151 Encounter for screening for human papillomavirus (HPV): Secondary | ICD-10-CM | POA: Diagnosis not present

## 2021-11-10 DIAGNOSIS — Z124 Encounter for screening for malignant neoplasm of cervix: Secondary | ICD-10-CM | POA: Diagnosis not present

## 2021-11-18 DIAGNOSIS — L814 Other melanin hyperpigmentation: Secondary | ICD-10-CM | POA: Diagnosis not present

## 2021-11-18 DIAGNOSIS — L821 Other seborrheic keratosis: Secondary | ICD-10-CM | POA: Diagnosis not present

## 2021-11-18 DIAGNOSIS — D225 Melanocytic nevi of trunk: Secondary | ICD-10-CM | POA: Diagnosis not present

## 2021-11-18 DIAGNOSIS — D2239 Melanocytic nevi of other parts of face: Secondary | ICD-10-CM | POA: Diagnosis not present

## 2021-11-29 ENCOUNTER — Other Ambulatory Visit: Payer: Self-pay | Admitting: Primary Care

## 2021-12-12 DIAGNOSIS — N390 Urinary tract infection, site not specified: Secondary | ICD-10-CM | POA: Diagnosis not present

## 2021-12-12 DIAGNOSIS — R319 Hematuria, unspecified: Secondary | ICD-10-CM | POA: Diagnosis not present

## 2021-12-12 DIAGNOSIS — R350 Frequency of micturition: Secondary | ICD-10-CM | POA: Diagnosis not present

## 2021-12-12 DIAGNOSIS — H6992 Unspecified Eustachian tube disorder, left ear: Secondary | ICD-10-CM | POA: Diagnosis not present

## 2021-12-12 DIAGNOSIS — N200 Calculus of kidney: Secondary | ICD-10-CM | POA: Diagnosis not present

## 2021-12-18 DIAGNOSIS — R3129 Other microscopic hematuria: Secondary | ICD-10-CM | POA: Diagnosis not present

## 2021-12-18 DIAGNOSIS — R350 Frequency of micturition: Secondary | ICD-10-CM | POA: Diagnosis not present

## 2021-12-24 ENCOUNTER — Ambulatory Visit
Admission: RE | Admit: 2021-12-24 | Discharge: 2021-12-24 | Disposition: A | Discharge status: Home / Self Care | Payer: PPO | Source: Ambulatory Visit | Attending: Internal Medicine | Admitting: Internal Medicine

## 2021-12-24 DIAGNOSIS — Z780 Asymptomatic menopausal state: Secondary | ICD-10-CM | POA: Diagnosis not present

## 2021-12-24 DIAGNOSIS — M85852 Other specified disorders of bone density and structure, left thigh: Secondary | ICD-10-CM

## 2021-12-24 DIAGNOSIS — M8589 Other specified disorders of bone density and structure, multiple sites: Secondary | ICD-10-CM | POA: Diagnosis not present

## 2022-01-07 DIAGNOSIS — S20212A Contusion of left front wall of thorax, initial encounter: Secondary | ICD-10-CM | POA: Diagnosis not present

## 2022-01-26 DIAGNOSIS — R3129 Other microscopic hematuria: Secondary | ICD-10-CM | POA: Diagnosis not present

## 2022-01-26 DIAGNOSIS — D259 Leiomyoma of uterus, unspecified: Secondary | ICD-10-CM | POA: Diagnosis not present

## 2022-02-03 ENCOUNTER — Ambulatory Visit: Payer: PPO | Admitting: Pulmonary Disease

## 2022-02-04 DIAGNOSIS — R350 Frequency of micturition: Secondary | ICD-10-CM | POA: Diagnosis not present

## 2022-02-04 DIAGNOSIS — R3129 Other microscopic hematuria: Secondary | ICD-10-CM | POA: Diagnosis not present

## 2022-02-13 ENCOUNTER — Encounter: Payer: Self-pay | Admitting: Pulmonary Disease

## 2022-02-13 ENCOUNTER — Ambulatory Visit: Payer: PPO | Admitting: Pulmonary Disease

## 2022-02-13 VITALS — BP 120/70 | HR 75 | Ht 64.0 in | Wt 145.0 lb

## 2022-02-13 DIAGNOSIS — J309 Allergic rhinitis, unspecified: Secondary | ICD-10-CM

## 2022-02-13 DIAGNOSIS — J4520 Mild intermittent asthma, uncomplicated: Secondary | ICD-10-CM | POA: Diagnosis not present

## 2022-02-13 NOTE — Progress Notes (Signed)
Synopsis: Referred in April 2021 for cough by Enid Skeens., MD.  Subjective:   PATIENT ID: Belinda Mcmillan: female DOB: 12-Apr-1951, MRN: [id number]  HPI  Chief Complaint  Patient presents with   Follow-up   Belinda Mcmillan is a 71 year old woman, never smoker with history of mild intermittent asthma who returns for follow up.  She has done well since last visit. She is taking zyrtec and montelukast daily. She has not required her albuterol or flovent inhalers over the past year.   OV 01/27/22 She is using flovent inhaler as needed and has been primarily using it at night prior to bedtime. She has not been experiencing any nighttime symptoms to prompt it's use. She is not requiring albuterol as needed.   She continues to have ear fullness and did not contact Dr. Redmond Baseman after last follow up for further evaluation/management.   She has seasonal allergies worse in the spring and fall. She is using saline nasal spray daily. Using fluticasone nasal spray as needed.   Past Medical History:  Diagnosis Date   Allergy    Diabetes mellitus June 2012   T2DM   Hyperlipidemia    Hypertension      Family History  Problem Relation Age of Onset   Cancer Mother        melanoma   Cancer Father        lung   Healthy Sister    Healthy Daughter    Cancer Other    Breast cancer Maternal Aunt 88     Social History   Socioeconomic History   Marital status: Widowed    Spouse name: Not on file   Number of children: Not on file   Years of education: Not on file   Highest education level: Not on file  Occupational History   Not on file  Tobacco Use   Smoking status: Never    Passive exposure: Yes   Smokeless tobacco: Never  Vaping Use   Vaping Use: Never used  Substance and Sexual Activity   Alcohol use: No   Drug use: No   Sexual activity: Never  Other Topics Concern   Not on file  Social History Narrative   Not on file   Social Determinants of Health    Financial Resource Strain: Not on file  Food Insecurity: Not on file  Transportation Needs: Not on file  Physical Activity: Not on file  Stress: Not on file  Social Connections: Not on file  Intimate Partner Violence: Not on file     Allergies  Allergen Reactions   Penicillins      Outpatient Medications Prior to Visit  Medication Sig Dispense Refill   cetirizine-pseudoephedrine (ZYRTEC-D) 5-120 MG tablet Take 1 tablet by mouth daily.     fluticasone (FLONASE) 50 MCG/ACT nasal spray SPRAY 2 SPRAYS INTO EACH NOSTRIL EVERY DAY 48 mL 3   albuterol (VENTOLIN HFA) 108 (90 Base) MCG/ACT inhaler Inhale 2 puffs into the lungs every 4 (four) hours as needed for wheezing or shortness of breath. (Patient not taking: Reported on 02/13/2022)     atorvastatin (LIPITOR) 10 MG tablet Take 20 mg by mouth daily.      famotidine (PEPCID) 20 MG tablet TAKE 1 TABLET BY MOUTH EVERY DAY 90 tablet 1   fluticasone (FLOVENT HFA) 110 MCG/ACT inhaler Inhale 2 puffs into the lungs 2 (two) times daily. 1 Inhaler 11   losartan (COZAAR) 100 MG tablet Take 100 mg by mouth daily.  montelukast (SINGULAIR) 10 MG tablet TAKE 1 TABLET BY MOUTH EVERYDAY AT BEDTIME (Patient not taking: Reported on 02/13/2022) 90 tablet 3   No facility-administered medications prior to visit.    Review of Systems  Constitutional:  Negative for chills, fever, malaise/fatigue and weight loss.  HENT:  Negative for sinus pain and sore throat. Ear pain: pressure.  Eyes: Negative.   Respiratory:  Negative for hemoptysis, sputum production, shortness of breath and wheezing.   Cardiovascular:  Negative for chest pain, palpitations, orthopnea, claudication and leg swelling.  Gastrointestinal:  Negative for abdominal pain, heartburn, nausea and vomiting.  Genitourinary: Negative.   Musculoskeletal:  Negative for joint pain and myalgias.  Skin:  Negative for rash.  Neurological:  Negative for weakness.  Endo/Heme/Allergies: Negative.    Psychiatric/Behavioral: Negative.     Objective:   Vitals:   02/13/22 1415  BP: 120/70  Pulse: 75  SpO2: 98%  Weight: 145 lb (65.8 kg)  Height: '5\' 4"'$  (1.626 m)   Physical Exam Constitutional:      General: She is not in acute distress.    Appearance: She is not ill-appearing.  HENT:     Head: Normocephalic and atraumatic.  Eyes:     General: No scleral icterus.    Conjunctiva/sclera: Conjunctivae normal.     Pupils: Pupils are equal, round, and reactive to light.  Cardiovascular:     Rate and Rhythm: Normal rate and regular rhythm.     Pulses: Normal pulses.     Heart sounds: Normal heart sounds. No murmur heard. Pulmonary:     Effort: Pulmonary effort is normal.     Breath sounds: Normal breath sounds. No wheezing, rhonchi or rales.  Musculoskeletal:     Right lower leg: No edema.     Left lower leg: No edema.  Skin:    General: Skin is warm and dry.  Neurological:     General: No focal deficit present.     Mental Status: She is alert.    CBC No results found for: "WBC", "RBC", "HGB", "HCT", "PLT", "MCV", "MCH", "MCHC", "RDW", "LYMPHSABS", "MONOABS", "EOSABS", "BASOSABS"   Chest imaging: Chest Imaging- films reviewed: CXR, 2 view 11/24/2019-kyphosis, no opacities   CXR, 2 view 10/24/2019-bilateral airway thickening, no opacities.  Kyphosis.   PFT:     No data to display          Spirometry 12/08/2019: FVC 4.0L (125%)--> 3.2L (101%, -20%) FEV1 2.6 (107%) -->2.8 (116%, +9%) Ratio 65%--> 88% Reversible mild obstruction   Assessment & Plan:   Mild intermittent asthma without complication  Allergic rhinitis, unspecified seasonality, unspecified trigger  Discussion: Belinda Mcmillan is a 71 year old woman, never smoker with history of mild intermittent asthma who returns for follow up.  Her asthma continues to be well controlled at this time with montelukast and zyrtec daily. She has not required her inhalers. She can use albuterol and flovent as needed  in the future.   Follow up as needed.  Belinda Jackson, MD [facility] Office: [phone]    Current Outpatient Medications:    cetirizine-pseudoephedrine (ZYRTEC-D) 5-120 MG tablet, Take 1 tablet by mouth daily., Disp: , Rfl:    fluticasone (FLONASE) 50 MCG/ACT nasal spray, SPRAY 2 SPRAYS INTO EACH NOSTRIL EVERY DAY, Disp: 48 mL, Rfl: 3   albuterol (VENTOLIN HFA) 108 (90 Base) MCG/ACT inhaler, Inhale 2 puffs into the lungs every 4 (four) hours as needed for wheezing or shortness of breath. (Patient not taking: Reported on 02/13/2022), Disp: ,  Rfl:    atorvastatin (LIPITOR) 10 MG tablet, Take 20 mg by mouth daily. , Disp: , Rfl:    famotidine (PEPCID) 20 MG tablet, TAKE 1 TABLET BY MOUTH EVERY DAY, Disp: 90 tablet, Rfl: 1   fluticasone (FLOVENT HFA) 110 MCG/ACT inhaler, Inhale 2 puffs into the lungs 2 (two) times daily., Disp: 1 Inhaler, Rfl: 11   losartan (COZAAR) 100 MG tablet, Take 100 mg by mouth daily., Disp: , Rfl:    montelukast (SINGULAIR) 10 MG tablet, TAKE 1 TABLET BY MOUTH EVERYDAY AT BEDTIME (Patient not taking: Reported on 02/13/2022), Disp: 90 tablet, Rfl: 3

## 2022-02-13 NOTE — Patient Instructions (Signed)
Continue zyrtec and Singulair daily  Use albuterol as needed 1-2 puffs every 4-6 hours as needed  Use flovent 1-2 puffs twice daily as needed  Follow up as needed

## 2022-02-20 ENCOUNTER — Other Ambulatory Visit: Payer: Self-pay | Admitting: Acute Care

## 2022-02-25 ENCOUNTER — Telehealth: Payer: Self-pay | Admitting: Pulmonary Disease

## 2022-02-25 NOTE — Telephone Encounter (Signed)
Called and left voicemail for patient to call back and tell us which inhaler she is asking about.

## 2022-02-25 NOTE — Telephone Encounter (Signed)
Patient would like the nurse to call to talk about a medication inhaler that she use to take.  She stated that she wanted to get a script for it but is not sure if she should still be taking it and would like to discuss.  CB# [phone]

## 2022-02-26 NOTE — Telephone Encounter (Signed)
Patient called back to let the nurse know that she needs the Abuterol and Flovent.  Please advise.

## 2022-03-03 MED ORDER — ALBUTEROL SULFATE HFA 108 (90 BASE) MCG/ACT IN AERS: 2.0000 | INHALATION_SPRAY | RESPIRATORY_TRACT | 3 refills | Status: DC | PRN

## 2022-03-03 NOTE — Telephone Encounter (Signed)
Spoke with pt and advised that refills for for Flovent and Albuterol were sent to pharmacy.

## 2022-03-03 NOTE — Telephone Encounter (Signed)
Patient states needs refill for Albuterol inhaler and Flovent. Pharmacy is [facility]. Patient phone number is [phone].

## 2022-03-17 ENCOUNTER — Telehealth: Payer: Self-pay | Admitting: Pulmonary Disease

## 2022-03-17 MED ORDER — FLUTICASONE PROPIONATE HFA 110 MCG/ACT IN AERO: INHALATION_SPRAY | RESPIRATORY_TRACT | 11 refills | Status: DC

## 2022-03-17 NOTE — Telephone Encounter (Signed)
Called and spoke with patient. She stated that she hasn't been able to get her Flovent from the pharmacy. She was able to get the albuterol. I advised her that the [facility] had been sent on 9/26 but I would resend the medication. She verbalized understanding. Nothing further needed.

## 2022-03-31 DIAGNOSIS — H16143 Punctate keratitis, bilateral: Secondary | ICD-10-CM | POA: Diagnosis not present

## 2022-03-31 DIAGNOSIS — H43813 Vitreous degeneration, bilateral: Secondary | ICD-10-CM | POA: Diagnosis not present

## 2022-03-31 DIAGNOSIS — H524 Presbyopia: Secondary | ICD-10-CM | POA: Diagnosis not present

## 2022-03-31 DIAGNOSIS — H52223 Regular astigmatism, bilateral: Secondary | ICD-10-CM | POA: Diagnosis not present

## 2022-03-31 DIAGNOSIS — H5213 Myopia, bilateral: Secondary | ICD-10-CM | POA: Diagnosis not present

## 2022-03-31 DIAGNOSIS — H2512 Age-related nuclear cataract, left eye: Secondary | ICD-10-CM | POA: Diagnosis not present

## 2022-04-02 DIAGNOSIS — R7989 Other specified abnormal findings of blood chemistry: Secondary | ICD-10-CM | POA: Diagnosis not present

## 2022-04-02 DIAGNOSIS — Z79899 Other long term (current) drug therapy: Secondary | ICD-10-CM | POA: Diagnosis not present

## 2022-04-02 DIAGNOSIS — E1159 Type 2 diabetes mellitus with other circulatory complications: Secondary | ICD-10-CM | POA: Diagnosis not present

## 2022-04-02 DIAGNOSIS — I10 Essential (primary) hypertension: Secondary | ICD-10-CM | POA: Diagnosis not present

## 2022-04-02 DIAGNOSIS — F419 Anxiety disorder, unspecified: Secondary | ICD-10-CM | POA: Diagnosis not present

## 2022-04-02 DIAGNOSIS — E785 Hyperlipidemia, unspecified: Secondary | ICD-10-CM | POA: Diagnosis not present

## 2022-04-10 DIAGNOSIS — E785 Hyperlipidemia, unspecified: Secondary | ICD-10-CM | POA: Diagnosis not present

## 2022-04-10 DIAGNOSIS — E559 Vitamin D deficiency, unspecified: Secondary | ICD-10-CM | POA: Diagnosis not present

## 2022-04-10 DIAGNOSIS — M85852 Other specified disorders of bone density and structure, left thigh: Secondary | ICD-10-CM | POA: Diagnosis not present

## 2022-04-10 DIAGNOSIS — Z1339 Encounter for screening examination for other mental health and behavioral disorders: Secondary | ICD-10-CM | POA: Diagnosis not present

## 2022-04-10 DIAGNOSIS — Z1331 Encounter for screening for depression: Secondary | ICD-10-CM | POA: Diagnosis not present

## 2022-04-10 DIAGNOSIS — Z86010 Personal history of colonic polyps: Secondary | ICD-10-CM | POA: Diagnosis not present

## 2022-04-10 DIAGNOSIS — I700 Atherosclerosis of aorta: Secondary | ICD-10-CM | POA: Diagnosis not present

## 2022-04-10 DIAGNOSIS — Z0000 Encounter for general adult medical examination without abnormal findings: Secondary | ICD-10-CM | POA: Diagnosis not present

## 2022-04-10 DIAGNOSIS — I10 Essential (primary) hypertension: Secondary | ICD-10-CM | POA: Diagnosis not present

## 2022-04-10 DIAGNOSIS — E1159 Type 2 diabetes mellitus with other circulatory complications: Secondary | ICD-10-CM | POA: Diagnosis not present

## 2022-05-19 ENCOUNTER — Ambulatory Visit (INDEPENDENT_AMBULATORY_CARE_PROVIDER_SITE_OTHER): Payer: PPO

## 2022-05-19 ENCOUNTER — Other Ambulatory Visit: Payer: Self-pay | Admitting: Podiatry

## 2022-05-19 ENCOUNTER — Ambulatory Visit: Payer: PPO | Admitting: Podiatry

## 2022-05-19 VITALS — BP 138/84

## 2022-05-19 DIAGNOSIS — M722 Plantar fascial fibromatosis: Secondary | ICD-10-CM | POA: Diagnosis not present

## 2022-05-19 DIAGNOSIS — M79671 Pain in right foot: Secondary | ICD-10-CM

## 2022-05-19 DIAGNOSIS — M779 Enthesopathy, unspecified: Secondary | ICD-10-CM

## 2022-05-19 MED ORDER — MELOXICAM 7.5 MG PO TABS: 7.5000 mg | ORAL_TABLET | Freq: Every day | ORAL | 0 refills | Status: DC

## 2022-05-19 NOTE — Patient Instructions (Addendum)
Plantar Fasciitis (Heel Spur Syndrome) with Rehab The plantar fascia is a fibrous, ligament-like, soft-tissue structure that spans the bottom of the foot. Plantar fasciitis is a condition that causes pain in the foot due to inflammation of the tissue. SYMPTOMS   Pain and tenderness on the underneath side of the foot.  Pain that worsens with standing or walking. CAUSES  Plantar fasciitis is caused by irritation and injury to the plantar fascia on the underneath side of the foot. Common mechanisms of injury include:  Direct trauma to bottom of the foot.  Damage to a small nerve that runs under the foot where the main fascia attaches to the heel bone.  Stress placed on the plantar fascia due to bone spurs. RISK INCREASES WITH:   Activities that place stress on the plantar fascia (running, jumping, pivoting, or cutting).  Poor strength and flexibility.  Improperly fitted shoes.  Tight calf muscles.  Flat feet.  Failure to warm-up properly before activity.  Obesity. PREVENTION  Warm up and stretch properly before activity.  Allow for adequate recovery between workouts.  Maintain physical fitness:  Strength, flexibility, and endurance.  Cardiovascular fitness.  Maintain a health body weight.  Avoid stress on the plantar fascia.  Wear properly fitted shoes, including arch supports for individuals who have flat feet.  PROGNOSIS  If treated properly, then the symptoms of plantar fasciitis usually resolve without surgery. However, occasionally surgery is necessary.  RELATED COMPLICATIONS   Recurrent symptoms that may result in a chronic condition.  Problems of the lower back that are caused by compensating for the injury, such as limping.  Pain or weakness of the foot during push-off following surgery.  Chronic inflammation, scarring, and partial or complete fascia tear, occurring more often from repeated injections.  TREATMENT  Treatment initially involves the  use of ice and medication to help reduce pain and inflammation. The use of strengthening and stretching exercises may help reduce pain with activity, especially stretches of the Achilles tendon. These exercises may be performed at home or with a therapist. Your caregiver may recommend that you use heel cups of arch supports to help reduce stress on the plantar fascia. Occasionally, corticosteroid injections are given to reduce inflammation. If symptoms persist for greater than 6 months despite non-surgical (conservative), then surgery may be recommended.   MEDICATION   If pain medication is necessary, then nonsteroidal anti-inflammatory medications, such as aspirin and ibuprofen, or other minor pain relievers, such as acetaminophen, are often recommended.  Do not take pain medication within 7 days before surgery.  Prescription pain relievers may be given if deemed necessary by your caregiver. Use only as directed and only as much as you need.  Corticosteroid injections may be given by your caregiver. These injections should be reserved for the most serious cases, because they may only be given a certain number of times.  HEAT AND COLD  Cold treatment (icing) relieves pain and reduces inflammation. Cold treatment should be applied for 10 to 15 minutes every 2 to 3 hours for inflammation and pain and immediately after any activity that aggravates your symptoms. Use ice packs or massage the area with a piece of ice (ice massage).  Heat treatment may be used prior to performing the stretching and strengthening activities prescribed by your caregiver, physical therapist, or athletic trainer. Use a heat pack or soak the injury in warm water.  SEEK IMMEDIATE MEDICAL CARE IF:  Treatment seems to offer no benefit, or the condition worsens.  Any medications   produce adverse side effects.  EXERCISES- RANGE OF MOTION (ROM) AND STRETCHING EXERCISES - Plantar Fasciitis (Heel Spur Syndrome) These exercises  may help you when beginning to rehabilitate your injury. Your symptoms may resolve with or without further involvement from your physician, physical therapist or athletic trainer. While completing these exercises, remember:   Restoring tissue flexibility helps normal motion to return to the joints. This allows healthier, less painful movement and activity.  An effective stretch should be held for at least 30 seconds.  A stretch should never be painful. You should only feel a gentle lengthening or release in the stretched tissue.  RANGE OF MOTION - Toe Extension, Flexion  Sit with your right / left leg crossed over your opposite knee.  Grasp your toes and gently pull them back toward the top of your foot. You should feel a stretch on the bottom of your toes and/or foot.  Hold this stretch for 10 seconds.  Now, gently pull your toes toward the bottom of your foot. You should feel a stretch on the top of your toes and or foot.  Hold this stretch for 10 seconds. Repeat  times. Complete this stretch 3 times per day.   RANGE OF MOTION - Ankle Dorsiflexion, Active Assisted  Remove shoes and sit on a chair that is preferably not on a carpeted surface.  Place right / left foot under knee. Extend your opposite leg for support.  Keeping your heel down, slide your right / left foot back toward the chair until you feel a stretch at your ankle or calf. If you do not feel a stretch, slide your bottom forward to the edge of the chair, while still keeping your heel down.  Hold this stretch for 10 seconds. Repeat 3 times. Complete this stretch 2 times per day.   STRETCH  Gastroc, Standing  Place hands on wall.  Extend right / left leg, keeping the front knee somewhat bent.  Slightly point your toes inward on your back foot.  Keeping your right / left heel on the floor and your knee straight, shift your weight toward the wall, not allowing your back to arch.  You should feel a gentle stretch  in the right / left calf. Hold this position for 10 seconds. Repeat 3 times. Complete this stretch 2 times per day.  STRETCH  Soleus, Standing  Place hands on wall.  Extend right / left leg, keeping the other knee somewhat bent.  Slightly point your toes inward on your back foot.  Keep your right / left heel on the floor, bend your back knee, and slightly shift your weight over the back leg so that you feel a gentle stretch deep in your back calf.  Hold this position for 10 seconds. Repeat 3 times. Complete this stretch 2 times per day.  STRETCH  Gastrocsoleus, Standing  Note: This exercise can place a lot of stress on your foot and ankle. Please complete this exercise only if specifically instructed by your caregiver.   Place the ball of your right / left foot on a step, keeping your other foot firmly on the same step.  Hold on to the wall or a rail for balance.  Slowly lift your other foot, allowing your body weight to press your heel down over the edge of the step.  You should feel a stretch in your right / left calf.  Hold this position for 10 seconds.  Repeat this exercise with a slight bend in your right /   gain both the endurance and the strength needed for everyday activities through controlled exercises. Complete these exercises as instructed by your physician, physical therapist or athletic trainer. Progress the resistance and repetitions only as guided.  STRENGTH - Towel Curls Sit in a chair positioned on a non-carpeted surface. Place your foot on a towel, keeping your heel on the floor. Pull the towel toward your heel by only curling your toes. Keep your heel on the floor. Repeat 3 times.  Complete this exercise 2 times per day.  STRENGTH - Ankle Inversion Secure one end of a rubber exercise band/tubing to a fixed object (table, pole). Loop the other end around your foot just before your toes. Place your fists between your knees. This will focus your strengthening at your ankle. Slowly, pull your big toe up and in, making sure the band/tubing is positioned to resist the entire motion. Hold this position for 10 seconds. Have your muscles resist the band/tubing as it slowly pulls your foot back to the starting position. Repeat 3 times. Complete this exercises 2 times per day.  Document Released: 05/25/2005 Document Revised: 08/17/2011 Document Reviewed: 09/06/2008 [facility] Patient Information 2014 [place], [place]. Meloxicam Tablets What is this medication? MELOXICAM (mel OX i cam) treats mild to moderate pain, inflammation, or arthritis. It works by decreasing inflammation. It belongs to a group of medications called NSAIDs. This medicine may be used for other purposes; ask your health care provider or pharmacist if you have questions. COMMON BRAND NAME(S): Mobic What should I tell my care team before I take this medication? They need to know if you have any of these conditions: Asthma (lung or breathing disease) Bleeding disorder Coronary artery bypass graft (CABG) within the past 2 weeks Dehydration Frequently drink alcohol Heart attack Heart disease Heart failure High blood pressure Kidney disease Liver disease Stomach bleeding Stomach ulcers, other stomach or intestine problems Take medications that treat or prevent blood clots Taking other steroids, such as dexamethasone or prednisone Tobacco use An unusual or allergic reaction to meloxicam, other medications, foods, dyes, or preservatives Pregnant or trying to get pregnant Breast-feeding How should I use this medication? Take this medication by mouth. Take it as directed on the prescription label at the same  time every day. You can take it with or without food. If it upsets your stomach, take it with food. Do not use it more often than directed. There may be unused or extra doses in the bottle after you finish your treatment. Talk to your care team if you have questions about your dose. A special MedGuide will be given to you by the pharmacist with each prescription and refill. Be sure to read this information carefully each time. Talk to your care team about the use of this medication in children. Special care may be needed. People over 35 years of age may have a stronger reaction and need a smaller dose. Overdosage: If you think you have taken too much of this medicine contact a poison control center or emergency room at once. NOTE: This medicine is only for you. Do not share this medicine with others. What if I miss a dose? If you miss a dose, take it as soon as you can. If it is almost time for your next dose, take only that dose. Do not take double or extra doses. What may interact with this medication? Do not take this medication with any of the following: Cidofovir Ketorolac This medication may also interact with the following: Alcohol Aspirin  and aspirin-like medications Certain medications for blood pressure, heart disease, irregular heart beat Certain medications for mental health conditions Certain medications that treat or prevent blood clots, such as warfarin, enoxaparin, dalteparin, apixaban, dabigatran, rivaroxaban Cyclosporine Diuretics Fluconazole Lithium Methotrexate Other NSAIDs, medications for pain and inflammation, such as ibuprofen and naproxen Pemetrexed This list may not describe all possible interactions. Give your health care provider a list of all the medicines, herbs, non-prescription drugs, or dietary supplements you use. Also tell them if you smoke, drink alcohol, or use illegal drugs. Some items may interact with your medicine. What should I watch for while using  this medication? Visit your care team for regular checks on your progress. Tell your care team if your symptoms do not start to get better or if they get worse. Do not take other medications that contain aspirin, ibuprofen, or naproxen with this medication. Side effects such as stomach upset, nausea, or ulcers may be more likely to occur. Many non-prescription medications contain aspirin, ibuprofen, or naproxen. Always read labels carefully. This medication can cause serious ulcers and bleeding in the stomach. It can happen with no warning. Tobacco, alcohol, older age, and poor health can also increase risks. Call your care team right away if you have stomach pain or blood in your vomit or stool. This medication does not prevent a heart attack or stroke. This medication may increase the chance of a heart attack or stroke. The chance may increase the longer you use this medication or if you have heart disease. If you take aspirin to prevent a heart attack or stroke, talk to your care team about using this medication. This medication may cause serious skin reactions. They can happen weeks to months after starting the medication. Contact your care team right away if you notice fevers or flu-like symptoms with a rash. The rash may be red or purple and then turn into blisters or peeling of the skin. Or, you might notice a red rash with swelling of the face, lips or lymph nodes in your neck or under your arms. Talk to your care team if you wish to become pregnant or think you might be pregnant. This medication can cause serious birth defects. This medication may affect your coordination, reaction time, or judgment. Do not drive or operate machinery until you know how this medication affects you. Sit up or stand slowly to reduce the risk of dizzy or fainting spells. Drinking alcohol with this medication can increase the risk of these side effects. Be careful brushing or flossing your teeth or using a toothpick  because you may get an infection or bleed more easily. If you have any dental work done, tell your dentist you are receiving this medication. This medication may make it more difficult to get pregnant. Talk to your care team if you are concerned about your fertility. What side effects may I notice from receiving this medication? Side effects that you should report to your care team as soon as possible: Allergic reactions--skin rash, itching, hives, swelling of the face, lips, tongue, or throat Bleeding--bloody or black, tar-like stools, vomiting blood or brown material that looks like coffee grounds, red or dark brown urine, small red or purple spots on skin, unusual bruising or bleeding Heart attack--pain or tightness in the chest, shoulders, arms, or jaw, nausea, shortness of breath, cold or clammy skin, feeling faint or lightheaded Heart failure--shortness of breath, swelling of ankles, feet, or hands, sudden weight gain, unusual weakness or fatigue Increase in blood  pressure Kidney injury--decrease in the amount of urine, swelling of the ankles, hands, or feet Liver injury--right upper belly pain, loss of appetite, nausea, light-colored stool, dark yellow or brown urine, yellowing skin or eyes, unusual weakness or fatigue Rash, fever, and swollen lymph nodes Redness, blistering, peeling, or loosening of the skin, including inside the mouth Stroke--sudden numbness or weakness of the face, arm, or leg, trouble speaking, confusion, trouble walking, loss of balance or coordination, dizziness, severe headache, change in vision Side effects that usually do not require medical attention (report to your care team if they continue or are bothersome): Diarrhea Nausea Upset stomach This list may not describe all possible side effects. Call your doctor for medical advice about side effects. You may report side effects to FDA at 1-800-FDA-1088. Where should I keep my medication? Keep out of the reach of  children and pets. Store at room temperature between 20 and 25 degrees C (68 and 77 degrees F). Protect from moisture. Keep the container tightly closed. Get rid of any unused medication after the expiration date. To get rid of medications that are no longer needed or have expired: Take the medication to a medication take-back program. Check with your pharmacy or law enforcement to find a location. If you cannot return the medication, check the label or package insert to see if the medication should be thrown out in the garbage or flushed down the toilet. If you are not sure, ask your care team. If it is safe to put it in the trash, empty the medication out of the container. Mix the medication with cat litter, dirt, coffee grounds, or other unwanted substance. Seal the mixture in a bag or container. Put it in the trash. NOTE: This sheet is a summary. It may not cover all possible information. If you have questions about this medicine, talk to your doctor, pharmacist, or health care provider.  2023 [employer]vier/Gold Standard (2021-02-05 00:00:00)

## 2022-05-19 NOTE — Progress Notes (Signed)
Subjective:   Patient ID: Belinda Mcmillan, female   DOB: 71 y.o.   MRN: [id number]   HPI 71 year old female presents the office today with concerns.  She states that she had a similar issue years ago.  She states that she was going to surgery but is not really bothering her at this time.  She has tenderness to the top of the foot on the left side of the foot.  She does a lot of walking.  No injuries.  No numbness or tingling.  No recent treatment.  Patient says she does get left heel, no injuries.  She is diabetic and her last A1c was around 6.4 or 6.5. She is not on medicaiton. Diet controlled.     Review of Systems  All other systems reviewed and are negative.  Past Medical History:  Diagnosis Date   Allergy    Diabetes mellitus June 2012   T2DM   Hyperlipidemia    Hypertension     Past Surgical History:  Procedure Laterality Date   CHOLECYSTECTOMY  10/21/10   DILATION AND CURETTAGE OF UTERUS     Miscarriage x2       Current Outpatient Medications:    meloxicam (MOBIC) 7.5 MG tablet, Take 1 tablet (7.5 mg total) by mouth daily., Disp: 30 tablet, Rfl: 0   albuterol (VENTOLIN HFA) 108 (90 Base) MCG/ACT inhaler, Inhale 2 puffs into the lungs every 4 (four) hours as needed for wheezing or shortness of breath., Disp: 8 g, Rfl: 3   atorvastatin (LIPITOR) 10 MG tablet, Take 20 mg by mouth daily. , Disp: , Rfl:    cetirizine-pseudoephedrine (ZYRTEC-D) 5-120 MG tablet, Take 1 tablet by mouth daily., Disp: , Rfl:    famotidine (PEPCID) 20 MG tablet, TAKE 1 TABLET BY MOUTH EVERY DAY, Disp: 90 tablet, Rfl: 1   fluticasone (FLONASE) 50 MCG/ACT nasal spray, SPRAY 2 SPRAYS INTO EACH NOSTRIL EVERY DAY, Disp: 48 mL, Rfl: 3   fluticasone (FLOVENT HFA) 110 MCG/ACT inhaler, TAKE 2 PUFFS BY MOUTH TWICE A DAY, Disp: 12 g, Rfl: 11   losartan (COZAAR) 100 MG tablet, Take 100 mg by mouth daily., Disp: , Rfl:    montelukast (SINGULAIR) 10 MG tablet, TAKE 1 TABLET BY MOUTH EVERYDAY AT BEDTIME  (Patient not taking: Reported on 02/13/2022), Disp: 90 tablet, Rfl: 3  Allergies  Allergen Reactions   Penicillins           Objective:  Physical Exam  General: AAO x3, NAD  Dermatological: Skin is warm, dry and supple bilateral.  There are no open sores, no preulcerative lesions, no rash or signs of infection present.  Vascular: Dorsalis Pedis artery and Posterior Tibial artery pedal pulses are 2/4 bilateral with immedate capillary fill time. There is no pain with calf compression, swelling, warmth, erythema.   Neruologic: Grossly intact via light touch bilateral.  Negative Tinel sign.  Musculoskeletal: Tenderness palpation on the medial aspect of foot there is no specific area pinpoint tenderness.  There is no significant there is no erythema warmth fluctuance or extensor tendons.  On the left side she does get some discomfort in the arch, heel along the medial band plantar fascia.  Again there is no area pinpoint tenderness.  Gait: Unassisted, Nonantalgic.       Assessment:   71 year old female with right foot tendinitis/capsulitis; left foot plantar fasciitis     Plan:  -Treatment options discussed including all alternatives, risks, and complications -Etiology of symptoms were discussed -X-rays were obtained and reviewed  with the patient.  3 views right foot were obtained.  No evidence of acute fracture. -Prescribed mobic. Discussed side effects of the medication and directed to stop if any are to occur and call the office.  -We discussed stretching, icing daily.  Discussed using good arch supports.  X-ray left if still haivng pain  Trula Slade DPM

## 2022-07-07 DIAGNOSIS — R351 Nocturia: Secondary | ICD-10-CM | POA: Diagnosis not present

## 2022-07-21 DIAGNOSIS — N952 Postmenopausal atrophic vaginitis: Secondary | ICD-10-CM | POA: Diagnosis not present

## 2022-07-21 DIAGNOSIS — E119 Type 2 diabetes mellitus without complications: Secondary | ICD-10-CM | POA: Diagnosis not present

## 2022-07-21 DIAGNOSIS — N898 Other specified noninflammatory disorders of vagina: Secondary | ICD-10-CM | POA: Diagnosis not present

## 2022-07-23 DIAGNOSIS — N771 Vaginitis, vulvitis and vulvovaginitis in diseases classified elsewhere: Secondary | ICD-10-CM | POA: Diagnosis not present

## 2022-07-23 DIAGNOSIS — I10 Essential (primary) hypertension: Secondary | ICD-10-CM | POA: Diagnosis not present

## 2022-07-23 DIAGNOSIS — E1159 Type 2 diabetes mellitus with other circulatory complications: Secondary | ICD-10-CM | POA: Diagnosis not present

## 2022-07-23 DIAGNOSIS — I700 Atherosclerosis of aorta: Secondary | ICD-10-CM | POA: Diagnosis not present

## 2022-08-27 ENCOUNTER — Encounter: Payer: Self-pay | Admitting: Internal Medicine

## 2022-08-27 ENCOUNTER — Other Ambulatory Visit: Payer: Self-pay | Admitting: Family

## 2022-08-27 DIAGNOSIS — Z1231 Encounter for screening mammogram for malignant neoplasm of breast: Secondary | ICD-10-CM

## 2022-09-10 ENCOUNTER — Institutional Professional Consult (permissible substitution): Payer: PPO | Admitting: Neurology

## 2022-09-21 ENCOUNTER — Other Ambulatory Visit: Payer: Self-pay | Admitting: Pulmonary Disease

## 2022-09-23 ENCOUNTER — Institutional Professional Consult (permissible substitution): Payer: PPO | Admitting: Neurology

## 2022-10-12 ENCOUNTER — Ambulatory Visit: Payer: PPO

## 2022-10-15 ENCOUNTER — Institutional Professional Consult (permissible substitution): Payer: PPO | Admitting: Neurology

## 2022-10-23 ENCOUNTER — Ambulatory Visit: Payer: PPO

## 2022-11-04 ENCOUNTER — Encounter: Payer: Self-pay | Admitting: *Deleted

## 2022-11-05 ENCOUNTER — Ambulatory Visit (INDEPENDENT_AMBULATORY_CARE_PROVIDER_SITE_OTHER): Payer: Medicare HMO | Admitting: Neurology

## 2022-11-05 ENCOUNTER — Institutional Professional Consult (permissible substitution): Payer: PPO | Admitting: Neurology

## 2022-11-05 ENCOUNTER — Telehealth: Payer: Self-pay | Admitting: Neurology

## 2022-11-05 ENCOUNTER — Encounter: Payer: Self-pay | Admitting: Neurology

## 2022-11-05 VITALS — BP 124/63 | HR 72 | Ht 65.5 in | Wt 149.6 lb

## 2022-11-05 DIAGNOSIS — R351 Nocturia: Secondary | ICD-10-CM

## 2022-11-05 DIAGNOSIS — R519 Headache, unspecified: Secondary | ICD-10-CM

## 2022-11-05 DIAGNOSIS — Z9189 Other specified personal risk factors, not elsewhere classified: Secondary | ICD-10-CM | POA: Diagnosis not present

## 2022-11-05 DIAGNOSIS — J392 Other diseases of pharynx: Secondary | ICD-10-CM

## 2022-11-05 DIAGNOSIS — G479 Sleep disorder, unspecified: Secondary | ICD-10-CM

## 2022-11-05 NOTE — Patient Instructions (Signed)

## 2022-11-05 NOTE — Progress Notes (Signed)
Subjective:    Patient ID: Belinda Mcmillan is a 72 y.o. female.  HPI    History:   Dear Dr. Nadene Rubins,   I saw your patient, Sirine Gloria, upon your kind request in my sleep clinic today for initial consultation of her sleep disorder, in particular, concern for underlying obstructive sleep apnea.  The patient is unaccompanied today.  As you know, Ms. Andria Meuse is a 72 year old female with an underlying medical history of reflux disease, hypertension, hyperlipidemia, vitamin D deficiency, asthma, diabetes, kidney stone, and borderline overweight state, who reports sleep disruption and possible concern for sleep apnea.  Her Epworth sleepiness score is 2 out of 24, fatigue severity score is 25 out of 63.  She was noted to have a small airway by her dentist.  I reviewed your office visit note from 07/23/2022.  She is not sure if she snores.  Her younger sister lives with her but has not complained about snoring.  Patient is widowed, she has 1 daughter and 2 grandchildren.  She does have nocturia about once or twice per average night and has had occasional morning headaches.  She does not have any pets in the household.  She is a non-smoker and drinks caffeine in the form of iced tea, 1 or 2 cups/day, some decaf coffee.  She does not drink any alcohol.  She is retired, she worked for a [employer].  She does not have a TV on in her bedroom at night.  She goes to bed around 9 PM and rise time is around 7 AM.  Her Past Medical History Is Significant For: Past Medical History:  Diagnosis Date   Allergy    Asthma    Calculus of kidney    Diabetes mellitus 11/07/2010   T2DM   GERD (gastroesophageal reflux disease)    Hyperlipidemia    Hypertension     Her Past Surgical History Is Significant For: Past Surgical History:  Procedure Laterality Date   CHOLECYSTECTOMY  10/21/10   DILATION AND CURETTAGE OF UTERUS     Miscarriage x2      Her Family History Is Significant  For: Family History  Problem Relation Age of Onset   Cancer Mother        melanoma   Cancer Father        lung   Healthy Sister    Healthy Daughter    Cancer Other    Breast cancer Maternal Aunt 50    Her Social History Is Significant For: Social History   Socioeconomic History   Marital status: Widowed    Spouse name: Not on file   Number of children: Not on file   Years of education: Not on file   Highest education level: Not on file  Occupational History   Not on file  Tobacco Use   Smoking status: Never    Passive exposure: Yes   Smokeless tobacco: Never  Vaping Use   Vaping Use: Never used  Substance and Sexual Activity   Alcohol use: No   Drug use: No   Sexual activity: Not on file  Other Topics Concern   Not on file  Social History Narrative   Caffiene - decaff 1-2 cups daily.     Retired from [employer].     Lives with sister Esau Grew Cox   Education HS grad   Widowed   One Child   Social Determinants of Health   Financial Resource Strain: Not on file  Food Insecurity: Not on file  Transportation Needs: Not on file  Physical Activity: Not on file  Stress: Not on file  Social Connections: Not on file    Her Allergies Are:  Allergies  Allergen Reactions   Penicillins   :   Her Current Medications Are:  Outpatient Encounter Medications as of 11/05/2022  Medication Sig   atorvastatin (LIPITOR) 20 MG tablet Take 20 mg by mouth daily.   cetirizine (ZYRTEC) 10 MG tablet Take 10 mg by mouth daily.   cholecalciferol (VITAMIN D3) 25 MCG (1000 UNIT) tablet Take 1,000 Units by mouth daily.   famotidine (PEPCID) 20 MG tablet TAKE 1 TABLET BY MOUTH EVERY DAY   glipiZIDE (GLUCOTROL XL) 2.5 MG 24 hr tablet Take 2.5 mg by mouth daily with breakfast.   losartan (COZAAR) 100 MG tablet Take 100 mg by mouth daily.   [DISCONTINUED] fluticasone (FLONASE) 50 MCG/ACT nasal spray SPRAY 2 SPRAYS INTO EACH NOSTRIL EVERY DAY (Patient not taking: Reported on 11/05/2022)    No facility-administered encounter medications on file as of 11/05/2022.  :  Review of Systems:  Out of a complete 14 point review of systems, all are reviewed and negative with the exception of these symptoms as listed below:  Review of Systems  Neurological:        Had been to dentist, and noted narrow airway, snoring.  Wakes at 0300 and cannot go back to sleep. ESS 2, FSS 25.    Objective:  Neurological Exam  Physical Exam Physical Examination:   Vitals:   11/05/22 1125  BP: 124/63  Pulse: 72    General Examination: The patient is a very pleasant 72 y.o. female in no acute distress. She appears well-developed and well-nourished and well groomed.   HEENT: Normocephalic, atraumatic, pupils are equal, round and reactive to light, extraocular tracking is good without limitation to gaze excursion or nystagmus noted. Hearing is grossly intact. Face is symmetric with normal facial animation. Speech is clear with no dysarthria noted. There is no hypophonia. There is no lip, neck/head, jaw or voice tremor. Neck is supple with full range of passive and active motion. There are no carotid bruits on auscultation. Oropharynx exam reveals: mild mouth dryness, adequate dental hygiene and mild airway crowding secondary to small airway entry, Mallampati class IV, tonsils and tip of uvula not fully visualized.  Neck circumference 15-1/2 inches, mild to moderate overbite noted.  Tongue protrudes centrally and palate elevates symmetrically but even with phonation, tonsils and uvula not fully visualized.   Chest: Clear to auscultation without wheezing, rhonchi or crackles noted.  Heart: S1+S2+0, regular and normal without murmurs, rubs or gallops noted.   Abdomen: Soft, non-tender and non-distended.  Extremities: There is no pitting edema in the distal lower extremities bilaterally.   Skin: Warm and dry without trophic changes noted.   Musculoskeletal: exam reveals no obvious joint deformities.    Neurologically:  Mental status: The patient is awake, alert and oriented in all 4 spheres. Her immediate and remote memory, attention, language skills and fund of knowledge are appropriate. There is no evidence of aphasia, agnosia, apraxia or anomia. Speech is clear with normal prosody and enunciation. Thought process is linear. Mood is normal and affect is normal.  Cranial nerves II - XII are as described above under HEENT exam.  Motor exam: Normal bulk, strength and tone is noted. There is no obvious action or resting tremor.  Fine motor skills and coordination: grossly intact.  Cerebellar testing: No dysmetria or  intention tremor. There is no truncal or gait ataxia.  Sensory exam: intact to light touch in the upper and lower extremities.  Gait, station and balance: She stands easily. No veering to one side is noted. No leaning to one side is noted. Posture is age-appropriate and stance is narrow based. Gait shows normal stride length and normal pace. No problems turning are noted.   Assessment and plan:  In summary, Lilyana Pazienza is a very pleasant 72 y.o.-year old female with an underlying medical history of reflux disease, hypertension, hyperlipidemia, vitamin D deficiency, asthma, diabetes, kidney stone, and borderline overweight state, whose history and physical exam are concerning for sleep disordered breathing, particularly obstructive sleep apnea (OSA) versus upper airway resistance syndrome (UARS) versus central sleep apnea (CSA), or mixed sleep apnea. A laboratory attended sleep study is typically considered "gold standard" for evaluation of sleep disordered breathing.   I had a long chat with the patient about my findings and the diagnosis of sleep apnea, particularly OSA, its prognosis and treatment options. We talked about medical/conservative treatments, surgical interventions and non-pharmacological approaches for symptom control. I explained, in particular, the risks and  ramifications of untreated moderate to severe OSA, especially with respect to developing cardiovascular disease down the road, including congestive heart failure (CHF), difficult to treat hypertension, cardiac arrhythmias (particularly A-fib), neurovascular complications including TIA, stroke and dementia. Even type 2 diabetes has, in part, been linked to untreated OSA. Symptoms of untreated OSA may include (but may not be limited to) daytime sleepiness, nocturia (i.e. frequent nighttime urination), memory problems, mood irritability and suboptimally controlled or worsening mood disorder such as depression and/or anxiety, lack of energy, lack of motivation, physical discomfort, as well as recurrent headaches, especially morning or nocturnal headaches. We talked about the importance of maintaining a healthy lifestyle and striving for healthy weight. In addition, we talked about the importance of striving for and maintaining good sleep hygiene. I recommended a sleep study at this time. I outlined the differences between a laboratory attended sleep study which is considered more comprehensive and accurate over the option of a home sleep test (HST); the latter may lead to underestimation of sleep disordered breathing in some instances and does not help with diagnosing upper airway resistance syndrome and is not accurate enough to diagnose primary central sleep apnea typically. I outlined possible surgical and non-surgical treatment options of OSA, including the use of a positive airway pressure (PAP) device (i.e. CPAP, AutoPAP/APAP or BiPAP in certain circumstances), a custom-made dental device (aka oral appliance, which would require a referral to a specialist dentist or orthodontist typically, and is generally speaking not considered for patients with full dentures or edentulous state), upper airway surgical options, such as traditional UPPP (which is not considered a first-line treatment) or the Inspire device  (hypoglossal nerve stimulator, which would involve a referral for consultation with an ENT surgeon, after careful selection, following inclusion criteria - also not first-line treatment). I explained the PAP treatment option to the patient in detail, as this is generally considered first-line treatment.  The patient indicated that she would be willing to try PAP therapy, if the need arises. I explained the importance of being compliant with PAP treatment, not only for insurance purposes but primarily to improve patient's symptoms symptoms, and for the patient's long term health benefit, including to reduce Her cardiovascular risks longer-term.    We will pick up our discussion about the next steps and treatment options after testing.  We will keep  her posted as to the test results by phone call and/or MyChart messaging where possible.  We will plan to follow-up in sleep clinic accordingly as well.  I answered all her questions today and the patient was in agreement.   I encouraged her to call with any interim questions, concerns, problems or updates or email [place] through MyChart.  Generally speaking, sleep test authorizations may take up to 2 weeks, sometimes less, sometimes longer, the patient is encouraged to get in touch with [place] if they do not hear back from the sleep lab staff directly within the next 2 weeks.  Thank you very much for allowing me to participate in the care of this nice patient. If I can be of any further assistance to you please do not hesitate to call me at [phone].  Sincerely,   Huston Foley, MD, PhD

## 2022-11-05 NOTE — Telephone Encounter (Signed)
error 

## 2022-11-11 ENCOUNTER — Ambulatory Visit: Payer: PPO

## 2022-11-12 ENCOUNTER — Ambulatory Visit: Payer: PPO

## 2022-11-18 ENCOUNTER — Telehealth: Payer: Self-pay | Admitting: Neurology

## 2022-11-18 NOTE — Telephone Encounter (Signed)
[employer] pending uploaded notes 

## 2022-11-23 ENCOUNTER — Ambulatory Visit
Admission: RE | Admit: 2022-11-23 | Discharge: 2022-11-23 | Disposition: A | Discharge status: Home / Self Care | Payer: Medicare HMO | Source: Ambulatory Visit | Attending: Family | Admitting: Family

## 2022-11-23 DIAGNOSIS — Z1231 Encounter for screening mammogram for malignant neoplasm of breast: Secondary | ICD-10-CM

## 2022-11-23 NOTE — Telephone Encounter (Signed)
NPSG [place]: [id number] (exp. 11/18/22 to 02/16/23)

## 2022-11-26 ENCOUNTER — Other Ambulatory Visit: Payer: Self-pay | Admitting: Family

## 2022-11-26 DIAGNOSIS — R928 Other abnormal and inconclusive findings on diagnostic imaging of breast: Secondary | ICD-10-CM

## 2022-12-02 NOTE — Telephone Encounter (Signed)
I spoke with the patient to schedule NPSG  She is scheduled for 02/23/2023 at 8 pm.  Mailed packet to the patient- I will redo the auth once appointment gets closer.

## 2022-12-03 ENCOUNTER — Ambulatory Visit
Admission: RE | Admit: 2022-12-03 | Discharge: 2022-12-03 | Disposition: A | Discharge status: Home / Self Care | Payer: Medicare HMO | Source: Ambulatory Visit | Attending: Family | Admitting: Family

## 2022-12-03 DIAGNOSIS — R928 Other abnormal and inconclusive findings on diagnostic imaging of breast: Secondary | ICD-10-CM

## 2022-12-07 NOTE — Telephone Encounter (Signed)
Updated insurance auth:   NPSG- [employer] Berkley Harvey: [id number] (exp. 12/07/22 to 04/06/23)

## 2023-02-23 ENCOUNTER — Ambulatory Visit (INDEPENDENT_AMBULATORY_CARE_PROVIDER_SITE_OTHER): Payer: Medicare HMO | Admitting: Neurology

## 2023-02-23 DIAGNOSIS — Z9989 Dependence on other enabling machines and devices: Secondary | ICD-10-CM

## 2023-02-23 DIAGNOSIS — G479 Sleep disorder, unspecified: Secondary | ICD-10-CM

## 2023-02-23 DIAGNOSIS — G4731 Primary central sleep apnea: Secondary | ICD-10-CM

## 2023-02-23 DIAGNOSIS — R519 Headache, unspecified: Secondary | ICD-10-CM

## 2023-02-23 DIAGNOSIS — G4733 Obstructive sleep apnea (adult) (pediatric): Secondary | ICD-10-CM

## 2023-02-23 DIAGNOSIS — Z9189 Other specified personal risk factors, not elsewhere classified: Secondary | ICD-10-CM

## 2023-02-23 DIAGNOSIS — G4720 Circadian rhythm sleep disorder, unspecified type: Secondary | ICD-10-CM

## 2023-02-23 DIAGNOSIS — R351 Nocturia: Secondary | ICD-10-CM

## 2023-02-23 DIAGNOSIS — J392 Other diseases of pharynx: Secondary | ICD-10-CM

## 2023-03-03 NOTE — Procedures (Unsigned)
Physician Interpretation:    Referred by: Dr. Dorinda Hill   History and Indication for Testing: 72 year old female with an underlying medical history of reflux disease, hypertension, hyperlipidemia, vitamin D deficiency, asthma, diabetes, kidney stone, and borderline overweight state, who reports sleep disruption and concern for sleep apnea.  Her Epworth sleepiness score is 2 out of 24, fatigue severity score is 25 out of 63.     TITRATION DETAILS (SEE ALSO TABLE AT THE END OF THE REPORT):  The patient was shown several different interfaces and was subsequently fitted with a *** mask from ***.  The patient was started on a pressure of 5 cm of water pressure and gradually titrated to a final titration pressure of ***.  The AHI was improved and optimized at a pressure of *** cm, on which the patient achieved a total sleep time of  ***minutes.  Residual AHI was ***, O2 nadir of ***, with ***sleep achieved.     EEG: Review of the EEG showed no abnormal electrical discharges and symmetrical bihemispheric findings.     EKG: The EKG revealed normal sinus rhythm (NSR). ***   AUDIO/VIDEO REVIEW: The audio and video review did not show any abnormal or unusual behaviors, movements, phonations or vocalizations. The patient took 2 restroom breaks. Snoring was noted, in the mild to moderate range.   POST-STUDY QUESTIONNAIRE: Post study, the patient indicated, that sleep was *** the same as usual.    IMPRESSION:   Primary Central Sleep Apnea (CSA) Obstructive Sleep Apnea (OSA) Insufficient treatment with CPAP therapy Dysfunctions associated with sleep stages or arousal from sleep   RECOMMENDATIONS:     This patient has severe sleep disordered breathing with a primary central component and a lesser obstructive component.  She qualified for an emergent split study.  Treatment with PAP therapy was initiated but treatment with standard CPAP and standard BiPAP therapy was not enough to treat her  primary central apnea.  BiPAP ST improved her sleep disordered breathing significantly with an excellent result achieved eventually with BiPAP ST of 16/12 cm with a backup rate of 10/min.  Residual AHI was 0/h with a total sleep time of 27.5 minutes, and over 90% of (non-supine) REM sleep achieved on this setting, O2 nadir 90%.  The baseline AHI was 49.8/h, and O2 nadir 83%.  Over 50% of respiratory events were central apneas. I recommend home BiPAP ST at a pressure of 16/12 cm with a backup rate of 10/min, via small Vitera fullface mask with heated humidity. The patient will be advised to be fully compliant with PAP therapy to improve sleep related symptoms and decrease long term cardiovascular risks. Please note, that untreated obstructive sleep apnea may carry additional perioperative morbidity. Patients with significant obstructive sleep apnea should receive perioperative PAP therapy and the surgeons and particularly the anesthesiologist should be informed of the diagnosis and the severity of the sleep disordered breathing. This study shows sleep fragmentation and abnormal sleep stage percentages; these are nonspecific findings and per se do not signify an intrinsic sleep disorder or a cause for the patient's sleep-related symptoms. Causes include (but are not limited to) the first night effect of the sleep study, circadian rhythm disturbances, medication effect or an underlying mood disorder or medical problem.  The patient should be cautioned not to drive, work at heights, or operate dangerous or heavy equipment when tired or sleepy. Review and reiteration of good sleep hygiene measures should be pursued with any patient. The patient will be seen in follow-up  in the sleep clinic at [facility] for discussion of the test results, symptom and treatment compliance review, further management strategies, etc. The referring provider will be notified of the test results.    I certify that I have reviewed the entire raw  data recording prior to the issuance of this report in accordance with the Standards of Accreditation of the American Academy of Sleep Medicine (AASM).   Huston Foley, MD, PhD Medical Director, [facility]) Diplomat, ABPN (Neurology and Sleep)   Technical Report:   ***  Titration Table:   ***

## 2023-03-04 NOTE — Addendum Note (Signed)
Addended by: Huston Foley on: 03/04/2023 06:35 PM   Modules accepted: Orders

## 2023-03-05 ENCOUNTER — Telehealth: Payer: Self-pay | Admitting: *Deleted

## 2023-03-05 NOTE — Telephone Encounter (Signed)
Spoke to pt gave sleep study results Pt aware of her severe OSA  Pt chose [facility] for DME  Pt aware of insurance compliance Made pt f/u  appointment with Amy,NP 06/2023  Faxed over orders to [facility] this am and sent referring MD sleep study results Pt expressed understanding and thanked me for calling

## 2023-03-05 NOTE — Telephone Encounter (Signed)
-----   Message from Huston Foley sent at 03/04/2023  6:35 PM EDT ----- Patient referred by Dr. Nadene Rubins, seen by me on 5/30, patient had a split night sleep study on 02/23/23. Please call and notify patient that the recent sleep study showed severe sleep apnea. She did not do well with a standard CPAP machine but eventually had good results with a so called BiPAP ST which is a more sophisticated machine and can help treat central sleep apnea and obstructive, which is a more complex sleep apnea condition, which is what she has. I would like to prescribe a home BiPAP machine. I placed the order in the chart.  Please advise patient that we will need a follow up appointment with either myself or one of our nurse practitioners in about 2-3 months post set-up to check for how they are doing on treatment and how well it's going with the machine in general. Most insurance company require a certain compliance percentage to continue to cover/pay for the machine. Please ask patient to schedule this FU appointment, according to the set-up date, which is the day they receive the machine. Please make sure, the patient understands the importance of keeping this window for the FU appointment, as it is often an Barista and not our rule. Failing to adhere to this may result in losing coverage for sleep apnea treatment, at which point some insurances require repeating the whole process. Plus, monitoring compliance data is usually good feedback for the patient as far as how they are doing, how many hours they are on it, how well the mask fits, etc.  Also remind patient, that any PAP machine or mask issues should be first addressed with the DME company, who provided the machine/supplies.  Please ask if patient has a preference regarding DME company, may depend on the insurance too.  Huston Foley, MD, PhD [facility])

## 2023-06-17 ENCOUNTER — Encounter: Payer: Self-pay | Admitting: Family Medicine

## 2023-06-25 ENCOUNTER — Telehealth: Payer: Self-pay | Admitting: Neurology

## 2023-06-25 DIAGNOSIS — G4731 Primary central sleep apnea: Secondary | ICD-10-CM

## 2023-06-25 DIAGNOSIS — G4733 Obstructive sleep apnea (adult) (pediatric): Secondary | ICD-10-CM

## 2023-06-25 DIAGNOSIS — Z9989 Dependence on other enabling machines and devices: Secondary | ICD-10-CM

## 2023-06-25 DIAGNOSIS — G4720 Circadian rhythm sleep disorder, unspecified type: Secondary | ICD-10-CM

## 2023-06-25 NOTE — Telephone Encounter (Signed)
Pt states she had her sleep study back in Sept of '24 she would like to move forward in getting CPAP, please call pt to discuss.

## 2023-06-25 NOTE — Telephone Encounter (Signed)
Appears previously the orders were sent to [facility]. I will resend the order to them advising the pt is ready to move forward.  Called the patient to advise. There was no answer. Left a detailed message advising her that order was resent to [facility]. Provided their number and informed her to call them if she doesn't hear anything. Advised the patient of cpap compliance and that she will need to make sure that we see her in a ov 31-90 days from the date she is set up

## 2023-06-29 NOTE — Telephone Encounter (Signed)
I called pt and let her know I was following up on he orders for her machine.  I told her [facility] [address]  She has # and will try to contact if she has not heard from them.  She appreciated call back.  I relayed she is to let us know if any issues.  She verbalized understanding.

## 2023-07-02 ENCOUNTER — Ambulatory Visit: Payer: No Typology Code available for payment source | Admitting: Podiatry

## 2023-07-05 NOTE — Telephone Encounter (Signed)
Received this message:  New, [name], Otilio Jefferson, RN; Alain Honey; Jeris Penta, Melissa; 1 other Hello Belinda Mcmillan,  Yes, we have her order. However, we are still in need of updated order per the notes on account. We need the order date updated to be within 90 days due to her insurance.    Just to provide details for this :   on the initail order the patient cancelled her appt causing the order to be voided.  Then she requested to get pap and now the order date is past the needed date range.  Thank you,  Brad New  ---------------------  Order has been rewritten for Bipap.

## 2023-07-05 NOTE — Addendum Note (Signed)
Addended by: Bertram Savin on: 07/05/2023 01:03 PM   Modules accepted: Orders

## 2023-07-05 NOTE — Telephone Encounter (Signed)
I sent a high priority message to [facility] to check on this.

## 2023-07-05 NOTE — Telephone Encounter (Signed)
Pt states she spoke to [facility] this morning who says they still do not have her order. Advised of previous notes that it was sent. Would like someone to try to contact them for her. Requesting call back after it has been taken care of

## 2023-07-05 NOTE — Telephone Encounter (Signed)
Belinda Mcmillan, [name], Otilio Jefferson, RN; Kathyrn Sheriff Received, thank you!

## 2023-07-07 NOTE — Telephone Encounter (Signed)
I called Brad w/ [facility]. He said the patient is on the list to be called to setup her cpap. She should be called at the latest by Friday. I called the pt and let her know. I also reiterated the compliance requirements per insurance. The pt will call us to schedule her initial follow-up appointment as soon as she gets her machine. She thanked me for the call and her questions were answered.

## 2023-07-07 NOTE — Telephone Encounter (Signed)
Pt is asking for a call with update regarding her getting her CPAP machine, she states this has been going on for 3 weeks

## 2023-08-03 ENCOUNTER — Telehealth: Payer: Self-pay | Admitting: Neurology

## 2023-08-03 NOTE — Telephone Encounter (Signed)
 Pt has called to report that someone called to her inquire about her CPAP.  Pt asked it be noted that she is unable to afford the CPAP at this time, will call back to pursue at a later time.  Pt has not requested a call back.

## 2023-08-04 NOTE — Telephone Encounter (Signed)
 Noted.  (As per last 10-2022 office note) pt aware of risks of not using machine for severe sleep apnea.

## 2023-09-29 ENCOUNTER — Other Ambulatory Visit: Payer: Self-pay | Admitting: Family

## 2023-09-29 DIAGNOSIS — Z1231 Encounter for screening mammogram for malignant neoplasm of breast: Secondary | ICD-10-CM

## 2023-10-31 ENCOUNTER — Other Ambulatory Visit: Payer: Self-pay | Admitting: Pulmonary Disease

## 2023-11-24 ENCOUNTER — Ambulatory Visit

## 2023-12-06 ENCOUNTER — Ambulatory Visit

## 2024-03-15 ENCOUNTER — Ambulatory Visit: Admitting: Neurology

## 2024-06-05 NOTE — Telephone Encounter (Signed)
 I called the patient and she went to see her cardiologist last week and was told to follow-up with her sleep doctor about alternative to a sleep machine.   Patient has medicare/devoted health insurance. She states it would be $700 and at this time is unable to do a payment plan option for a CPAP machine. Patient was last seen may of 2024 and is due for an office visit. I scheduled her for 06/28/24 with Dr. Buck to discuss her options.

## 2024-06-05 NOTE — Telephone Encounter (Signed)
 Pt  called to request to speak to MD  about other  treatment since she wasn't able to get CPAP machine the patient wanted to know what she can do

## 2024-06-27 ENCOUNTER — Telehealth: Payer: Self-pay | Admitting: Neurology

## 2024-06-27 NOTE — Telephone Encounter (Signed)
 Appointment details confirmed

## 2024-06-28 ENCOUNTER — Ambulatory Visit: Admitting: Neurology

## 2024-06-28 ENCOUNTER — Encounter: Payer: Self-pay | Admitting: Neurology

## 2024-06-28 VITALS — BP 119/68 | HR 72 | Ht 63.0 in | Wt 150.0 lb

## 2024-06-28 DIAGNOSIS — Z9989 Dependence on other enabling machines and devices: Secondary | ICD-10-CM

## 2024-06-28 DIAGNOSIS — G4731 Primary central sleep apnea: Secondary | ICD-10-CM | POA: Diagnosis not present

## 2024-06-28 DIAGNOSIS — G4733 Obstructive sleep apnea (adult) (pediatric): Secondary | ICD-10-CM | POA: Diagnosis not present

## 2024-06-28 NOTE — Progress Notes (Signed)
 Subjective:    Patient ID: Belinda Mcmillan is a 74 y.o. female.  HPI    Interim history:   Belinda Mcmillan is a 74 year old female with an underlying medical history of reflux disease, hypertension, hyperlipidemia, vitamin D deficiency, asthma, diabetes, kidney stone, and borderline overweight state, who presents for follow-up consultation of her obstructive sleep apnea.  The patient is unaccompanied today.  I first met her at the request of her primary care provider in May 2024, at which time she reported sleep disruption possible concern for sleep apnea, her dentist had noted a small airway.  She was advised to proceed with a sleep study.  She had a split-night sleep study through our sleep lab on 02/23/2023 which showed primary central sleep apnea with severe sleep disordered breathing noted.  Standard CPAP and BiPAP therapy were not adequate to treat her central apneas.  She did reasonably well with BiPAP ST of 16/12 cm with a backup rate of 10.  She did not pursue PAP therapy.  Today, 06/28/2024: She reports that she was unable to afford a PAP machine previously and even now but when we talked about her test results and she is advised that there is really no good alternative treatment because of the nature of her sleep apnea, being mostly central versus obstructive, there is really no other good alternative treatment for her.  She is willing to look into getting a BiPAP machine.  I will place another order for BiPAP ST.  I placed an order last year for it but she did not pursue treatment at the time.  She is followed by [facility] cardiology and just recently had her echocardiogram.  She is supposed to have a stress test done.  Her cardiologist encouraged her to seek treatment for her sleep apnea.  I reviewed the cardiology office visit note from 05/26/2024.  Previously:  11/05/2022: (She) reports sleep disruption and possible concern for sleep apnea.  Her Epworth sleepiness score is 2 out of 24,  fatigue severity score is 25 out of 63.  She was noted to have a small airway by her dentist.  I reviewed your office visit note from 07/23/2022.  She is not sure if she snores.  Her younger sister lives with her but has not complained about snoring.  Patient is widowed, she has 1 daughter and 2 grandchildren.  She does have nocturia about once or twice per average night and has had occasional morning headaches.  She does not have any pets in the household.  She is a non-smoker and drinks caffeine in the form of iced tea, 1 or 2 cups/day, some decaf coffee.  She does not drink any alcohol .  She is retired, she worked for a [employer].  She does not have a TV on in her bedroom at night.  She goes to bed around 9 PM and rise time is around 7 AM.    The patient's allergies, current medications, family history, past medical history, past social history, past surgical history and problem list were reviewed and updated as appropriate.    Her Past Medical History Is Significant For: Past Medical History:  Diagnosis Date   Allergy    Asthma    Calculus of kidney    Diabetes mellitus 11/07/2010   T2DM   GERD (gastroesophageal reflux disease)    Hyperlipidemia    Hypertension     Her Past Surgical History Is Significant For: Past Surgical History:  Procedure Laterality Date  CHOLECYSTECTOMY  10/21/10   DILATION AND CURETTAGE OF UTERUS     Miscarriage x2      Her Family History Is Significant For: Family History  Problem Relation Age of Onset   Cancer Mother        melanoma   Cancer Father        lung   Healthy Sister    Healthy Daughter    Cancer Other    Breast cancer Maternal Aunt 50    Her Social History Is Significant For: Social History   Socioeconomic History   Marital status: Widowed    Spouse name: Not on file   Number of children: Not on file   Years of education: Not on file   Highest education level: Not on file  Occupational History   Not on  file  Tobacco Use   Smoking status: Never    Passive exposure: Yes   Smokeless tobacco: Never  Vaping Use   Vaping status: Never Used  Substance and Sexual Activity   Alcohol  use: No   Drug use: No   Sexual activity: Not on file  Other Topics Concern   Not on file  Social History Narrative   Caffiene - decaff 1-2 cups daily.     Retired from [employer].     Lives with sister Madelin Ruth Cox   Education HS grad   Widowed   One Child   Social Drivers of Health   Tobacco Use: Medium Risk (06/28/2024)   Patient History    Smoking Tobacco Use: Never    Smokeless Tobacco Use: Never    Passive Exposure: Yes  Financial Resource Strain: Not on file  Food Insecurity: Not on file  Transportation Needs: Not on file  Physical Activity: Not on file  Stress: Not on file  Social Connections: Not on file  Depression (EYV7-0): Not on file  Alcohol  Screen: Not on file  Housing: Not on file  Utilities: Not on file  Health Literacy: Not on file    Her Allergies Are:  Allergies[1]:   Her Current Medications Are:  Outpatient Encounter Medications as of 06/28/2024  Medication Sig   atorvastatin (LIPITOR) 20 MG tablet Take 20 mg by mouth daily.   cetirizine (ZYRTEC) 10 MG tablet Take 10 mg by mouth daily.   famotidine  (PEPCID ) 20 MG tablet TAKE 1 TABLET BY MOUTH EVERY DAY   fluticasone  (FLONASE ) 50 MCG/ACT nasal spray SPRAY 2 SPRAYS INTO EACH NOSTRIL EVERY DAY   glipiZIDE (GLUCOTROL XL) 2.5 MG 24 hr tablet Take 2.5 mg by mouth daily with breakfast.   losartan (COZAAR) 100 MG tablet Take 100 mg by mouth daily.   metoprolol succinate (TOPROL-XL) 25 MG 24 hr tablet Take 25 mg by mouth daily.   cholecalciferol (VITAMIN D3) 25 MCG (1000 UNIT) tablet Take 1,000 Units by mouth daily. (Patient not taking: Reported on 06/28/2024)   No facility-administered encounter medications on file as of 06/28/2024.  :  Review of Systems:  Out of a complete 14 point review of systems, all are reviewed and  negative with the exception of these symptoms as listed below:  Review of Systems  Objective:  Neurological Exam  Physical Exam Physical Examination:   Vitals:   06/28/24 1308  BP: 119/68  Pulse: 72    General Examination: The patient is a very pleasant 74 y.o. female in no acute distress. She appears well-developed and well-nourished and well groomed.   HEENT: Normocephalic, atraumatic, pupils are equal, round and reactive to  light, extraocular tracking is good without limitation to gaze excursion or nystagmus noted. Hearing is grossly intact. Face is symmetric with normal facial animation. Speech is clear with no dysarthria noted. There is no hypophonia. There is no lip, neck/head, jaw or voice tremor. Neck is supple with full range of passive and active motion. There are no carotid bruits on auscultation. Oropharynx exam reveals: mild mouth dryness, adequate dental hygiene and mild airway crowding secondary to small airway entry, Mallampati class IV, tonsils and tip of uvula not fully visualized.  Neck circumference 15-1/2 inches, mild to moderate overbite noted.  Tongue protrudes centrally and palate elevates symmetrically but even with phonation, tonsils and uvula not fully visualized.    Chest: Clear to auscultation without wheezing, rhonchi or crackles noted.   Heart: S1+S2+0, regular and normal without murmurs, rubs or gallops noted.    Abdomen: Soft, non-tender and non-distended.   Extremities: There is no pitting edema in the distal lower extremities bilaterally.    Skin: Warm and dry without trophic changes noted.    Musculoskeletal: exam reveals no obvious joint deformities.    Neurologically:  Mental status: The patient is awake, alert and oriented in all 4 spheres. Her immediate and remote memory, attention, language skills and fund of knowledge are appropriate. There is no evidence of aphasia, agnosia, apraxia or anomia. Speech is clear with normal prosody and  enunciation. Thought process is linear. Mood is normal and affect is normal.  Cranial nerves II - XII are as described above under HEENT exam.  Motor exam: Normal bulk, strength and tone is noted. There is no obvious action or resting tremor.  Fine motor skills and coordination: grossly intact.  Cerebellar testing: No dysmetria or intention tremor. There is no truncal or gait ataxia.  Sensory exam: intact to light touch in the upper and lower extremities.  Gait, station and balance: She stands easily. No veering to one side is noted. No leaning to one side is noted. Posture is age-appropriate and stance is narrow based. Gait shows normal stride length and normal pace. No problems turning are noted.    Assessment and plan:  In summary, Angelise Petrich is a very pleasant 74 year old female with an underlying medical history of reflux disease, hypertension, hyperlipidemia, vitamin D deficiency, asthma, diabetes, chest pain, palpitations, dizziness, kidney stone, and borderline overweight state, who presents for follow-up consultation of her sleep disordered breathing to discuss treatment options.  She reports that her cardiologist has encouraged her to seek treatment for sleep apnea.  We discussed her study results and treatment options, given that she has primary central sleep apnea, alternatives besides BiPAP ST are really not feasible. Her split-night sleep study from September 2024 showed severe sleep disordered breathing with a primary central component and a lesser obstructive component.  She qualified for an emergent split study.  Treatment with PAP therapy was initiated but treatment with standard CPAP and standard BiPAP therapy was not enough to treat her primary central apnea.  BiPAP ST improved her sleep disordered breathing significantly with an excellent result achieved eventually with BiPAP ST of 16/12 cm with a backup rate of 10/min.  The baseline AHI was 49.8/h, and O2 nadir 83%.  Over 50%  of respiratory events were central apneas.  We talked about her test results again today.  I still recommend home BiPAP ST at a pressure of 16/12 cm with a backup rate of 10/min.  She is agreeable to proceeding at this time.  I have  placed a new order for it and she is advised to make an appointment in our clinic for compliance and symptom recheck in about 3 months post set up date.  I answered all her questions today and she was in agreement.   I spent 32 minutes in total face-to-face time and in reviewing records during pre-charting, more than 50% of which was spent in counseling and coordination of care, reviewing test results, reviewing medications and treatment regimen and/or in discussing or reviewing the diagnosis of CSA, OSA, the prognosis and treatment options. Pertinent laboratory and imaging test results that were available during this visit with the patient were reviewed by me and considered in my medical decision making (see chart for details).      [1]  Allergies Allergen Reactions   Penicillins

## 2024-06-28 NOTE — Patient Instructions (Signed)
 As discussed, we will start you on home BiPAP ST therapy with a machine through a DME company.  Let us  know if you have not heard from them in the next 2 weeks. Please be advised that your insurance will look for compliance with your machine meaning that you use it at least a minimum amount of time consistently.  We will plan a follow-up in about 3 months from now.

## 2024-07-04 ENCOUNTER — Telehealth: Payer: Self-pay | Admitting: Neurology

## 2024-07-04 NOTE — Telephone Encounter (Signed)
 Resent order to [facility] today . Pt aware

## 2024-07-04 NOTE — Telephone Encounter (Signed)
 Pt called wanting to know if a cpap machine has been ordered for her. Please advise.

## 2024-09-26 ENCOUNTER — Ambulatory Visit: Admitting: Neurology
